# Patient Record
Sex: Male | Born: 1945
Health system: Southern US, Community
[De-identification: ages and names within clinical notes are randomized; demographics above are authoritative.]

## PROBLEM LIST (undated history)

## (undated) DIAGNOSIS — I499 Cardiac arrhythmia, unspecified: Secondary | ICD-10-CM

## (undated) DIAGNOSIS — G709 Myoneural disorder, unspecified: Secondary | ICD-10-CM

## (undated) DIAGNOSIS — K5900 Constipation, unspecified: Secondary | ICD-10-CM

## (undated) DIAGNOSIS — F419 Anxiety disorder, unspecified: Secondary | ICD-10-CM

## (undated) DIAGNOSIS — M48061 Spinal stenosis, lumbar region without neurogenic claudication: Secondary | ICD-10-CM

## (undated) DIAGNOSIS — M5416 Radiculopathy, lumbar region: Secondary | ICD-10-CM

## (undated) DIAGNOSIS — Z87442 Personal history of urinary calculi: Secondary | ICD-10-CM

## (undated) HISTORY — PX: MITRAL VALVE REPAIR: SHX2039

## (undated) HISTORY — PX: KIDNEY STONE SURGERY: SHX686

---

## 2005-01-14 ENCOUNTER — Inpatient Hospital Stay: Payer: Self-pay | Admitting: Internal Medicine

## 2005-01-17 ENCOUNTER — Emergency Department: Payer: Self-pay | Admitting: Emergency Medicine

## 2005-02-10 ENCOUNTER — Ambulatory Visit: Payer: Self-pay | Admitting: Cardiovascular Disease

## 2006-05-03 ENCOUNTER — Ambulatory Visit: Payer: Self-pay | Admitting: Urology

## 2020-03-21 ENCOUNTER — Other Ambulatory Visit: Payer: Self-pay | Admitting: Internal Medicine

## 2020-04-11 ENCOUNTER — Other Ambulatory Visit: Payer: Self-pay | Admitting: Orthopedic Surgery

## 2020-04-11 DIAGNOSIS — M5416 Radiculopathy, lumbar region: Secondary | ICD-10-CM

## 2020-04-22 ENCOUNTER — Other Ambulatory Visit: Payer: Self-pay | Admitting: Internal Medicine

## 2020-04-23 ENCOUNTER — Ambulatory Visit
Admission: RE | Admit: 2020-04-23 | Discharge: 2020-04-23 | Disposition: A | Discharge status: Home / Self Care | Payer: Medicare HMO | Source: Ambulatory Visit | Attending: Orthopedic Surgery | Admitting: Orthopedic Surgery

## 2020-04-23 ENCOUNTER — Other Ambulatory Visit: Payer: Self-pay

## 2020-04-23 DIAGNOSIS — M5416 Radiculopathy, lumbar region: Secondary | ICD-10-CM | POA: Diagnosis not present

## 2020-04-24 ENCOUNTER — Ambulatory Visit: Payer: Self-pay

## 2020-04-25 ENCOUNTER — Ambulatory Visit: Payer: Self-pay

## 2020-05-21 ENCOUNTER — Other Ambulatory Visit: Payer: Self-pay | Admitting: Neurosurgery

## 2020-05-26 ENCOUNTER — Other Ambulatory Visit: Payer: Self-pay | Admitting: Internal Medicine

## 2020-05-27 ENCOUNTER — Other Ambulatory Visit: Payer: Self-pay

## 2020-05-27 ENCOUNTER — Encounter
Admission: RE | Admit: 2020-05-27 | Discharge: 2020-05-27 | Disposition: A | Discharge status: Home / Self Care | Payer: Medicare HMO | Source: Ambulatory Visit | Attending: Neurosurgery | Admitting: Neurosurgery

## 2020-05-27 DIAGNOSIS — Z01818 Encounter for other preprocedural examination: Secondary | ICD-10-CM | POA: Insufficient documentation

## 2020-05-27 HISTORY — DX: Personal history of urinary calculi: Z87.442

## 2020-05-27 HISTORY — DX: Myoneural disorder, unspecified: G70.9

## 2020-05-27 HISTORY — DX: Cardiac arrhythmia, unspecified: I49.9

## 2020-05-27 HISTORY — DX: Anxiety disorder, unspecified: F41.9

## 2020-05-27 HISTORY — DX: Spinal stenosis, lumbar region without neurogenic claudication: M48.061

## 2020-05-27 HISTORY — DX: Radiculopathy, lumbar region: M54.16

## 2020-05-27 LAB — TYPE AND SCREEN
ABO/RH(D): O POS
Antibody Screen: NEGATIVE

## 2020-05-27 LAB — CBC
HCT: 34.4 % — ABNORMAL LOW (ref 39.0–52.0)
Hemoglobin: 12.2 g/dL — ABNORMAL LOW (ref 13.0–17.0)
MCH: 33.2 pg (ref 26.0–34.0)
MCHC: 35.5 g/dL (ref 30.0–36.0)
MCV: 93.5 fL (ref 80.0–100.0)
Platelets: 213 10*3/uL (ref 150–400)
RBC: 3.68 MIL/uL — ABNORMAL LOW (ref 4.22–5.81)
RDW: 13.2 % (ref 11.5–15.5)
WBC: 6.3 10*3/uL (ref 4.0–10.5)
nRBC: 0 % (ref 0.0–0.2)

## 2020-05-27 LAB — BASIC METABOLIC PANEL
Anion gap: 7 (ref 5–15)
BUN: 13 mg/dL (ref 8–23)
CO2: 29 mmol/L (ref 22–32)
Calcium: 8.4 mg/dL — ABNORMAL LOW (ref 8.9–10.3)
Chloride: 94 mmol/L — ABNORMAL LOW (ref 98–111)
Creatinine, Ser: 0.78 mg/dL (ref 0.61–1.24)
GFR calc Af Amer: >60 mL/min (ref >60)
GFR calc non Af Amer: >60 mL/min (ref >60)
Glucose, Bld: 108 mg/dL — ABNORMAL HIGH (ref 70–99)
Potassium: 3.4 mmol/L — ABNORMAL LOW (ref 3.5–5.1)
Sodium: 130 mmol/L — ABNORMAL LOW (ref 135–145)

## 2020-05-27 LAB — SURGICAL PCR SCREEN
MRSA, PCR: NEGATIVE
Staphylococcus aureus: NEGATIVE

## 2020-05-27 LAB — PROTIME-INR
INR: 0.9 (ref 0.8–1.2)
Prothrombin Time: 11.7 seconds (ref 11.4–15.2)

## 2020-05-27 LAB — APTT: aPTT: 27 seconds (ref 24–36)

## 2020-05-27 NOTE — Progress Notes (Addendum)
  [facility] Perioperative Services: Pre-Admission/Anesthesia Testing  Abnormal Lab Notification    Date: 05/27/20  Name: Shane Henderson MRN:   [id number]  Re: Abnormal labs noted during PAT appointment   Provider(s) Notified: Deetta Perla, MD Notification mode: Routed and/or faxed via [facility]   ABNORMAL LAB VALUE(S): Lab Results  Component Value Date   NA 130 (L) 05/27/2020   K 3.4 (L) 05/27/2020   CALCIUM 8.4 (L) 05/27/2020    Notes: Patient is on loop diuretic therapy (furosemide). Patient is scheduled for a L4-L5 laminectomy on 06/03/2020. Current values should have no implications on planned surgical/anesthetic course, however I will send to surgeon to make him aware. This is a Community education officer; no formal response is required.  Honor Loh, MSN, APRN, FNP-C, CEN [facility]  Peri-operative Services Nurse Practitioner Phone: [phone] 05/27/20 2:15 PM

## 2020-05-27 NOTE — Patient Instructions (Signed)
Your procedure is scheduled on: Monday 06/03/20.  Report to DAY SURGERY DEPARTMENT LOCATED ON 2ND FLOOR MEDICAL MALL ENTRANCE. To find out your arrival time please call [phone] between 1PM - 3PM on Friday 05/31/20.   Remember: Instructions that are not followed completely may result in serious medical risk, up to and including death, or upon the discretion of your surgeon and anesthesiologist your surgery may need to be rescheduled.     __X__ 1. Do not eat food after midnight the night before your procedure.                 No gum chewing or hard candies. You may drink clear liquids up to 2 hours                 before you are scheduled to arrive for your surgery- DO NOT drink clear                 liquids within 2 hours of the start of your surgery.                 Clear Liquids include:  water, apple juice without pulp, clear carbohydrate                 drink such as Clearfast or Gatorade, Black Coffee or Tea (Do not add                 milk or creamer to coffee or tea).  __X__2.  On the morning of surgery brush your teeth with toothpaste and water, you may rinse your mouth with mouthwash if you wish.  Do not swallow any toothpaste or mouthwash.    __X__ 3.  No Alcohol for 24 hours before or after surgery.  __X__ 4.  Do Not Smoke or use e-cigarettes For 24 Hours Prior to Your Surgery.                 Do not use any chewable tobacco products for at least 6 hours prior to                 surgery.  __X__5.  Notify your doctor if there is any change in your medical condition      (cold, fever, infections).      Do NOT wear jewelry, make-up, hairpins, clips or nail polish. Do NOT wear lotions, powders, or perfumes.  Do NOT shave 48 hours prior to surgery. Men may shave face and neck. Do NOT bring valuables to the hospital.     [facility] is not responsible for any belongings or valuables.   Contacts, dentures/partials or body piercings may not be worn into surgery. Bring a  case for your contacts, glasses or hearing aids, a denture cup will be supplied.    Patients discharged the day of surgery will not be allowed to drive home.     __X__ Take these medicines the morning of surgery with A SIP OF WATER:     1. amiodarone (PACERONE)   2. carvedilol (COREG)      __X__ Use CHG Soap as directed  __X__ Stop Anti-inflammatories 7 days before surgery such as Advil, Ibuprofen, Motrin, BC or Goodies Powder, Naprosyn, Naproxen, Aleve, Aspirin, Meloxicam. May take Tylenol or Tramadol if needed for pain or discomfort.   __X__Do not start taking any new herbal supplements or vitamins prior to your procedure.     Wear comfortable clothing (specific to your surgery type) to the hospital.  Plan  for stool softeners for home use; pain medications have a tendency to cause constipation. You can also help prevent constipation by eating foods high in fiber such as fruits and vegetables and drinking plenty of fluids as your diet allows.  After surgery, you can prevent lung complications by doing breathing exercises.Take deep breaths and cough every 1-2 hours. Your doctor may order a device called an Incentive Spirometer to help you take deep breaths.  Please call the [place] Department at [phone] if you have any questions about these instructions

## 2020-05-28 NOTE — Progress Notes (Signed)
  [facility] Perioperative Services: Pre-Admission/Anesthesia Testing     Date: 05/28/20  Name: Shane Henderson MRN:   [id number]  Re: Cardiac clearance for surgery  Patient pending laminectomy on 06/03/2020 with Dr. Deetta Perla.  During preoperative review, it is noted the patient has a past medical history significant for heart failure with reduced ejection fraction.  Patient was followed by cardiology Humphrey Rolls, MD), however I am unable to access clinical records and [facility].  At request sent to cardiology office requesting notes and results of any cardiac testing for this patient.  Received fax communication back on the morning of 05/28/2020 advising that further assessment/work-up prior to surgery is recommended.  Communicated with Hoy Register, NP via [facility] regarding cardiac clearance.  Cardiology provider requesting repeat TTE to assess for worsening HFrEF.    Per Cathren Laine, NP patient has been contacted to make aware of the need for clearance testing.  Patient is having TTE on 05/29/2020 with a follow-up visit on 05/30/2020 to discuss the results.  Cardiology provider to follow-up with PAT regarding clearance to proceed with surgery vs. need for further testing following office visit on 05/30/2020.Honor Loh, MSN, APRN, FNP-C, CEN [facility]  Peri-operative Services Nurse Practitioner Phone: [phone] 05/28/20 9:30 AM

## 2020-05-30 ENCOUNTER — Other Ambulatory Visit
Admission: RE | Admit: 2020-05-30 | Discharge: 2020-05-30 | Disposition: A | Discharge status: Home / Self Care | Payer: Medicare HMO | Source: Ambulatory Visit | Attending: Neurosurgery | Admitting: Neurosurgery

## 2020-05-30 ENCOUNTER — Other Ambulatory Visit: Payer: Self-pay

## 2020-05-30 DIAGNOSIS — Z20822 Contact with and (suspected) exposure to covid-19: Secondary | ICD-10-CM | POA: Insufficient documentation

## 2020-05-30 DIAGNOSIS — Z01812 Encounter for preprocedural laboratory examination: Secondary | ICD-10-CM | POA: Diagnosis present

## 2020-05-30 LAB — URINALYSIS, COMPLETE (UACMP) WITH MICROSCOPIC
Bacteria, UA: NONE SEEN
Bilirubin Urine: NEGATIVE
Glucose, UA: NEGATIVE mg/dL
Hgb urine dipstick: NEGATIVE
Ketones, ur: NEGATIVE mg/dL
Leukocytes,Ua: NEGATIVE
Nitrite: NEGATIVE
Protein, ur: NEGATIVE mg/dL
Specific Gravity, Urine: 1.011 (ref 1.005–1.030)
Squamous Epithelial / HPF: NONE SEEN (ref 0–5)
pH: 6 (ref 5.0–8.0)

## 2020-05-30 LAB — SARS CORONAVIRUS 2 (TAT 6-24 HRS): SARS Coronavirus 2: NEGATIVE

## 2020-05-31 NOTE — Progress Notes (Signed)
[facility] Perioperative Services  Pre-Admission/Anesthesia Testing Clinical Review  Date: 05/31/20  Patient Demographics:  Name: Shane Henderson DOB:   06-Dec-1945 MRN:   [id number]  Planned Surgical Procedure(s):    Case: [id number] Date/Time: 06/03/20 0938   Procedure: L4/5 LAMINECTOMY WITH MET-RX (N/A )   Anesthesia type: General   Pre-op diagnosis: lumbar stenosis   Location: [facility] OR ROOM 03 / [place] ORS FOR ANESTHESIA GROUP   Surgeons: Deetta Perla, MD     NOTE: Available PAT nursing documentation and vital signs have been reviewed. Clinical nursing staff has updated patient's PMH/PSHx, current medication list, and drug allergies/intolerances to ensure comprehensive history available to assist in medical decision making as it pertains to the aforementioned surgical procedure and anticipated anesthetic course.   Clinical Discussion:  Shane Henderson is a 74 y.o. male who is submitted for pre-surgical anesthesia review and clearance prior to him undergoing the above procedure. Patient has never been a smoker. Pertinent PMH includes: HFrEF, HTN, paroxysmal A.fib, AV regurgitation, neuromuscular disorder, lumbar stenosis, lumbar DDD, anxiety.   Patient is followed by cardiology Humphrey Rolls, MD). He was last seen in the cardiology clinic on 05/30/2020; notes reviewed. Patient doing well from a cardiac perspective. He denies an angina/anginal equivalent symptoms. No shortness of breath, PND, orthopnea, or presyncope/syncope. Patient's only complaint in the office was back and leg pain secondary to known lumbar stenosis and radiculopathy. Blood pressure well controlled at 138/82 on prescribed ACEi and beta blocker therapy. Exam reveal no peripheral edema; takes PRN furosemide. EKG showed a junctional rhythm; no A.fib; compliant with amiodarone. TTE performed on 05/29/2020 revealing an LVEF of 45-50% (see full results below).  Pre-surgical cardiac clearance issued on 05/31/2020 with a LOW  risk stratification. This patient is on daily antiplatelet therapy. He has been instructed on recommendations for holding his low dose daily ASA for 7 days prior to his procedure. The patient has been instructed that his last dose of his anticoagulant will be on 05/27/2020.  He denies previous intra-operative complications with anesthesia. Patient has a history of lower back pain. Recent MRI results included below for review by the anesthesia team in preparation for the possibility of potential neuraxial anesthesia during his upcoming laminectomy.  Vitals with BMI 05/27/2020  Height '6\' 0"'   Weight 110 lbs  BMI 49.20  Systolic 100  Diastolic 78  Pulse 60    Providers/Specialists:   NOTE: Primary physician provider listed below. Patient may have been seen by APP or partner within same practice.   PROVIDER ROLE LAST Edsel Petrin, MD Neurosurgery 05/21/2020  Cletis Athens, MD Primary Care Provider ?  Neoma Laming, MD Cardiology 05/30/2020   Allergies:  Iodinated diagnostic agents  Current Home Medications:   No current facility-administered medications for this encounter.   Marland Kitchen amiodarone (PACERONE) 200 MG tablet  . carvedilol (COREG) 6.25 MG tablet  . enalapril (VASOTEC) 10 MG tablet  . furosemide (LASIX) 20 MG tablet  . PACERONE 200 MG tablet  . tamsulosin (FLOMAX) 0.4 MG CAPS capsule  . traMADol (ULTRAM) 50 MG tablet   History:   Past Medical History:  Diagnosis Date  . Anxiety   . Dysrhythmia   . History of kidney stones   . Lumbar radiculopathy   . Lumbar stenosis   . Neuromuscular disorder [facility])    Past Surgical History:  Procedure Laterality Date  . KIDNEY STONE SURGERY    . MITRAL VALVE REPAIR     No family history on file.  Social History   Tobacco Use  . Smoking status: Never Smoker  . Smokeless tobacco: Current User    Types: Chew  Vaping Use  . Vaping Use: Never used  Substance Use Topics  . Alcohol use: Yes    Comment: Beer  . Drug use: Never      Pertinent Clinical Results:  LABS: Labs reviewed: Acceptable for surgery.  Hospital Outpatient Visit on 05/30/2020  Component Date Value Ref Range Status  . SARS Coronavirus 2 05/30/2020 NEGATIVE  NEGATIVE Final   Comment: (NOTE) SARS-CoV-2 target nucleic acids are NOT DETECTED.  The SARS-CoV-2 RNA is generally detectable in upper and lower respiratory specimens during the acute phase of infection. Negative results do not preclude SARS-CoV-2 infection, do not rule out co-infections with other pathogens, and should not be used as the sole basis for treatment or other patient management decisions. Negative results must be combined with clinical observations, patient history, and epidemiological information. The expected result is Negative.  Fact Sheet for Patients: SugarRoll.be  Fact Sheet for Healthcare Providers: https://www.woods-mathews.com/  This test is not yet approved or cleared by the [place] FDA and  has been authorized for detection and/or diagnosis of SARS-CoV-2 by FDA under an Emergency Use Authorization (EUA). This EUA will remain  in effect (meaning this test can be used) for the duration of the COVID-19 declaration under Se                          ction 564(b)(1) of the Act, 21 U.S.C. section 360bbb-3(b)(1), unless the authorization is terminated or revoked sooner.  Performed at [facility], [place] [address] [place], [place] [address]   . Color, Urine 05/30/2020 YELLOW* YELLOW Final  . APPearance 05/30/2020 CLEAR* CLEAR Final  . Specific Gravity, Urine 05/30/2020 1.011  1.005 - 1.030 Final  . pH 05/30/2020 6.0  5.0 - 8.0 Final  . Glucose, UA 05/30/2020 NEGATIVE  NEGATIVE mg/dL Final  . Hgb urine dipstick 05/30/2020 NEGATIVE  NEGATIVE Final  . Bilirubin Urine 05/30/2020 NEGATIVE  NEGATIVE Final  . Ketones, ur 05/30/2020 NEGATIVE  NEGATIVE mg/dL Final  . Protein, ur 05/30/2020 NEGATIVE  NEGATIVE mg/dL  Final  . Nitrite 05/30/2020 NEGATIVE  NEGATIVE Final  . Chalmers Guest 05/30/2020 NEGATIVE  NEGATIVE Final  . RBC / HPF 05/30/2020 0-5  0 - 5 RBC/hpf Final  . WBC, UA 05/30/2020 0-5  0 - 5 WBC/hpf Final  . Bacteria, UA 05/30/2020 NONE SEEN  NONE SEEN Final  . Squamous Epithelial / LPF 05/30/2020 NONE SEEN  0 - 5 Final  . Mucus 05/30/2020 PRESENT   Final   Performed at [facility], [address] [place], [place] [address]  Hospital Outpatient Visit on 05/27/2020  Component Date Value Ref Range Status  . aPTT 05/27/2020 27  24 - 36 seconds Final   Performed at [facility], [address] [place], [place] [address]  . Sodium 05/27/2020 130* 135 - 145 mmol/L Final  . Potassium 05/27/2020 3.4* 3.5 - 5.1 mmol/L Final  . Chloride 05/27/2020 94* 98 - 111 mmol/L Final  . CO2 05/27/2020 29  22 - 32 mmol/L Final  . Glucose, Bld 05/27/2020 108* 70 - 99 mg/dL Final   Glucose reference range applies only to samples taken after fasting for at least 8 hours.  . BUN 05/27/2020 13  8 - 23 mg/dL Final  . Creatinine, Ser 05/27/2020 0.78  0.61 - 1.24 mg/dL Final  .  Calcium 05/27/2020 8.4* 8.9 - 10.3 mg/dL Final  . GFR calc non Af Amer 05/27/2020 >60  >60 mL/min Final  . GFR calc Af Amer 05/27/2020 >60  >60 mL/min Final  . Anion gap 05/27/2020 7  5 - 15 Final   Performed at [facility], [address] [place], [place] [address]  . WBC 05/27/2020 6.3  4.0 - 10.5 K/uL Final  . RBC 05/27/2020 3.68* 4.22 - 5.81 MIL/uL Final  . Hemoglobin 05/27/2020 12.2* 13.0 - 17.0 g/dL Final  . HCT 05/27/2020 34.4* 39 - 52 % Final  . MCV 05/27/2020 93.5  80.0 - 100.0 fL Final  . MCH 05/27/2020 33.2  26.0 - 34.0 pg Final  . MCHC 05/27/2020 35.5  30.0 - 36.0 g/dL Final  . RDW 05/27/2020 13.2  11.5 - 15.5 % Final  . Platelets 05/27/2020 213  150 - 400 K/uL Final  . nRBC 05/27/2020 0.0  0.0 - 0.2 % Final   Performed at [facility], [address] [place], [place] [address]  .  MRSA, PCR 05/27/2020 NEGATIVE  NEGATIVE Final  . Staphylococcus aureus 05/27/2020 NEGATIVE  NEGATIVE Final   Comment: (NOTE) The Xpert SA Assay (FDA approved for NASAL specimens in patients 84 years of age and older), is one component of a comprehensive surveillance program. It is not intended to diagnose infection nor to guide or monitor treatment. Performed at [facility], [address], [place], [place] [address]   . ABO/RH(D) 05/27/2020 O POS   Final  . Antibody Screen 05/27/2020 NEG   Final  . Sample Expiration 05/27/2020 06/10/2020,2359   Final  . Extend sample reason 05/27/2020    Final                   Value:NO TRANSFUSIONS OR PREGNANCY IN THE PAST 3 MONTHS Performed at [facility], [address] [place], [place] [address]   . Prothrombin Time 05/27/2020 11.7  11.4 - 15.2 seconds Final  . INR 05/27/2020 0.9  0.8 - 1.2 Final   Comment: (NOTE) INR goal varies based on device and disease states. Performed at [facility], [address] [place],  [address]     ECG: Date: 05/27/2020 Time ECG obtained: 0751 AM Rate: 60 bpm Rhythm: junctional Axis (leads I and aVF): Left axis deviation Intervals: QRS 106 ms. QTc 474 ms. ST segment and T wave changes: TWI in leads II, III, aVF Comparison: No previous tracings available for review and comparison.    IMAGING / PROCEDURES: ECHOCARDIOGRAM done on 05/29/2020 1. LVEF 45-50% 2. Severe bilateral enlargement with ventricles and aorta appearing normal in size 3. Mild LV systolic dysfunction 4. RV systolic dysfunction 5. Normal LV wall motion 6. Abnormal RV wall motion 7. Mild LVH with grade 1 (relaxation abnormality) diastolic dysfunction 8. RV diastolic dysfunction 9. Mild pulmonary regurgitation 10. Moderate tricuspid regurgitation 11. Mild pulmonary hypertension 12. 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 Moderate to severe aortic regurgitation 14. No  pericardial effusion 15. Intra-atrial septal wall appears aneurysmal without evidence of shunting  MRI LUMBAR SPINE W/O CONTRAST done on 04/23/2020 1. Subacute benign-appearing L5 superior endplate compression fracture with up to 35% height loss centrally and 7 mm retropulsion. 2. Multilevel degenerative changes of the lumbar spine as described above, worst at L4-L5 where there is severe spinal canal and lateral recess stenosis.  Impression and Plan:  Shane Henderson has been referred for pre-anesthesia review and clearance prior him undergoing  the planned anesthetic and procedural courses. Available labs, pertinent testing, and imaging results were personally reviewed by me. This patient has been appropriately cleared by cardiology.   Based on clinical review performed today (05/31/20), barring any significant acute changes in the patient's overall condition, it is anticipated that he will be able to proceed with the planned surgical intervention. Any acute changes in clinical condition may necessitate his procedure being postponed and/or cancelled. Pre-surgical instructions were reviewed with the patient during his PAT appointment and questions were fielded by PAT clinical staff.  Honor Loh, MSN, APRN, FNP-C, CEN [facility]  Peri-operative Services Nurse Practitioner Phone: [phone] 05/31/20 11:34 AM  NOTE: This note has been prepared using Dragon dictation software. Despite my best ability to proofread, there is always the potential that unintentional transcriptional errors may still occur from this process.

## 2020-06-03 ENCOUNTER — Ambulatory Visit: Payer: Medicare HMO | Admitting: Urgent Care

## 2020-06-03 ENCOUNTER — Encounter
Admission: RE | Disposition: A | Discharge status: Home / Self Care | Payer: Self-pay | Source: Home / Self Care | Attending: Neurosurgery

## 2020-06-03 ENCOUNTER — Ambulatory Visit: Payer: Medicare HMO

## 2020-06-03 ENCOUNTER — Other Ambulatory Visit: Payer: Self-pay

## 2020-06-03 ENCOUNTER — Observation Stay
Admission: RE | Admit: 2020-06-03 | Discharge: 2020-06-03 | Disposition: A | Discharge status: Home / Self Care | Payer: Medicare HMO | Attending: Neurosurgery | Admitting: Neurosurgery

## 2020-06-03 ENCOUNTER — Encounter: Payer: Self-pay | Admitting: Neurosurgery

## 2020-06-03 DIAGNOSIS — Z419 Encounter for procedure for purposes other than remedying health state, unspecified: Secondary | ICD-10-CM

## 2020-06-03 DIAGNOSIS — M48061 Spinal stenosis, lumbar region without neurogenic claudication: Secondary | ICD-10-CM | POA: Diagnosis present

## 2020-06-03 DIAGNOSIS — F419 Anxiety disorder, unspecified: Secondary | ICD-10-CM | POA: Diagnosis not present

## 2020-06-03 DIAGNOSIS — M48062 Spinal stenosis, lumbar region with neurogenic claudication: Principal | ICD-10-CM | POA: Insufficient documentation

## 2020-06-03 DIAGNOSIS — M79609 Pain in unspecified limb: Secondary | ICD-10-CM | POA: Diagnosis present

## 2020-06-03 HISTORY — PX: LUMBAR LAMINECTOMY/ DECOMPRESSION WITH MET-RX: SHX5959

## 2020-06-03 LAB — ABO/RH: ABO/RH(D): O POS

## 2020-06-03 SURGERY — LUMBAR LAMINECTOMY/ DECOMPRESSION WITH MET-RX
Anesthesia: General

## 2020-06-03 MED ORDER — GLYCOPYRROLATE 0.2 MG/ML IJ SOLN: 0.2000 mg | Freq: Once | INTRAMUSCULAR | Status: AC

## 2020-06-03 MED ORDER — ACETAMINOPHEN 10 MG/ML IV SOLN
INTRAVENOUS | Status: DC | PRN
  Administered 2020-06-03: 500 mg via INTRAVENOUS

## 2020-06-03 MED ORDER — ROCURONIUM BROMIDE 10 MG/ML (PF) SYRINGE
PREFILLED_SYRINGE | INTRAVENOUS | Status: AC
  Filled 2020-06-03: qty 10

## 2020-06-03 MED ORDER — OXYCODONE HCL 5 MG PO TABS: 5.0000 mg | ORAL_TABLET | Freq: Four times a day (QID) | ORAL | 0 refills | Status: DC | PRN

## 2020-06-03 MED ORDER — MIDAZOLAM HCL 2 MG/2ML IJ SOLN
INTRAMUSCULAR | Status: AC
  Filled 2020-06-03: qty 2

## 2020-06-03 MED ORDER — LIDOCAINE HCL (PF) 2 % IJ SOLN
INTRAMUSCULAR | Status: AC
  Filled 2020-06-03: qty 5

## 2020-06-03 MED ORDER — FAMOTIDINE 20 MG PO TABS
ORAL_TABLET | ORAL | Status: AC
  Administered 2020-06-03: 20 mg via ORAL
  Filled 2020-06-03: qty 1

## 2020-06-03 MED ORDER — ONDANSETRON HCL 4 MG/2ML IJ SOLN: 4.0000 mg | Freq: Once | INTRAMUSCULAR | Status: DC | PRN

## 2020-06-03 MED ORDER — GLYCOPYRROLATE 0.2 MG/ML IJ SOLN
INTRAMUSCULAR | Status: AC
  Administered 2020-06-03: 0.2 mg via INTRAVENOUS
  Filled 2020-06-03: qty 1

## 2020-06-03 MED ORDER — ACETAMINOPHEN 10 MG/ML IV SOLN
INTRAVENOUS | Status: AC
  Filled 2020-06-03: qty 100

## 2020-06-03 MED ORDER — LIDOCAINE HCL (CARDIAC) PF 100 MG/5ML IV SOSY
PREFILLED_SYRINGE | INTRAVENOUS | Status: DC | PRN
  Administered 2020-06-03: 60 mg via INTRAVENOUS

## 2020-06-03 MED ORDER — FENTANYL CITRATE (PF) 100 MCG/2ML IJ SOLN
INTRAMUSCULAR | Status: AC
  Filled 2020-06-03: qty 2

## 2020-06-03 MED ORDER — PROPOFOL 10 MG/ML IV BOLUS
INTRAVENOUS | Status: AC
  Filled 2020-06-03: qty 40

## 2020-06-03 MED ORDER — PROPOFOL 10 MG/ML IV BOLUS
INTRAVENOUS | Status: DC | PRN
  Administered 2020-06-03: 80 mg via INTRAVENOUS

## 2020-06-03 MED ORDER — SUCCINYLCHOLINE CHLORIDE 20 MG/ML IJ SOLN
INTRAMUSCULAR | Status: DC | PRN
  Administered 2020-06-03: 60 mg via INTRAVENOUS

## 2020-06-03 MED ORDER — METHOCARBAMOL 500 MG PO TABS: 500.0000 mg | ORAL_TABLET | Freq: Four times a day (QID) | ORAL | Status: DC | PRN

## 2020-06-03 MED ORDER — FENTANYL CITRATE (PF) 100 MCG/2ML IJ SOLN: 25.0000 ug | INTRAMUSCULAR | Status: DC | PRN

## 2020-06-03 MED ORDER — TAMSULOSIN HCL 0.4 MG PO CAPS: 0.4000 mg | ORAL_CAPSULE | Freq: Every day | ORAL | Status: DC

## 2020-06-03 MED ORDER — METHOCARBAMOL 500 MG PO TABS: 500.0000 mg | ORAL_TABLET | Freq: Four times a day (QID) | ORAL | 0 refills | Status: DC | PRN

## 2020-06-03 MED ORDER — ACETAMINOPHEN 500 MG PO TABS: 500.0000 mg | ORAL_TABLET | Freq: Four times a day (QID) | ORAL | 0 refills | Status: DC | PRN

## 2020-06-03 MED ORDER — CEFAZOLIN SODIUM-DEXTROSE 2-4 GM/100ML-% IV SOLN
2.0000 g | Freq: Once | INTRAVENOUS | Status: AC
  Administered 2020-06-03: 2 g via INTRAVENOUS

## 2020-06-03 MED ORDER — OXYCODONE HCL 5 MG PO TABS: 5.0000 mg | ORAL_TABLET | Freq: Once | ORAL | Status: DC | PRN

## 2020-06-03 MED ORDER — DEXAMETHASONE SODIUM PHOSPHATE 10 MG/ML IJ SOLN
INTRAMUSCULAR | Status: DC | PRN
  Administered 2020-06-03: 10 mg via INTRAVENOUS

## 2020-06-03 MED ORDER — OXYCODONE HCL 5 MG/5ML PO SOLN: 5.0000 mg | Freq: Once | ORAL | Status: DC | PRN

## 2020-06-03 MED ORDER — ONDANSETRON HCL 4 MG/2ML IJ SOLN
INTRAMUSCULAR | Status: AC
  Filled 2020-06-03: qty 2

## 2020-06-03 MED ORDER — EPHEDRINE SULFATE 50 MG/ML IJ SOLN
INTRAMUSCULAR | Status: DC | PRN
  Administered 2020-06-03: 5 mg via INTRAVENOUS

## 2020-06-03 MED ORDER — OXYCODONE HCL 5 MG PO TABS: 5.0000 mg | ORAL_TABLET | ORAL | 0 refills | Status: DC | PRN

## 2020-06-03 MED ORDER — ACETAMINOPHEN 650 MG RE SUPP: 650.0000 mg | Freq: Four times a day (QID) | RECTAL | Status: DC | PRN

## 2020-06-03 MED ORDER — ONDANSETRON HCL 4 MG/2ML IJ SOLN
INTRAMUSCULAR | Status: DC | PRN
  Administered 2020-06-03: 4 mg via INTRAVENOUS

## 2020-06-03 MED ORDER — THROMBIN 5000 UNITS EX SOLR
CUTANEOUS | Status: DC | PRN
  Administered 2020-06-03: 5000 [IU] via TOPICAL

## 2020-06-03 MED ORDER — AMIODARONE HCL 200 MG PO TABS: 200.0000 mg | ORAL_TABLET | Freq: Every day | ORAL | Status: DC

## 2020-06-03 MED ORDER — LIDOCAINE-EPINEPHRINE (PF) 1 %-1:200000 IJ SOLN
INTRAMUSCULAR | Status: DC | PRN
  Administered 2020-06-03: 4 mL

## 2020-06-03 MED ORDER — ACETAMINOPHEN 325 MG PO TABS: 650.0000 mg | ORAL_TABLET | Freq: Four times a day (QID) | ORAL | Status: DC | PRN

## 2020-06-03 MED ORDER — CHLORHEXIDINE GLUCONATE 0.12 % MT SOLN: 15.0000 mL | Freq: Once | OROMUCOSAL | Status: AC

## 2020-06-03 MED ORDER — SENNA 8.6 MG PO TABS: 1.0000 | ORAL_TABLET | Freq: Two times a day (BID) | ORAL | 0 refills | Status: DC

## 2020-06-03 MED ORDER — TRAMADOL HCL 50 MG PO TABS: 50.0000 mg | ORAL_TABLET | Freq: Four times a day (QID) | ORAL | Status: DC | PRN

## 2020-06-03 MED ORDER — CARVEDILOL 12.5 MG PO TABS: 6.2500 mg | ORAL_TABLET | Freq: Every day | ORAL | Status: DC

## 2020-06-03 MED ORDER — SENNA 8.6 MG PO TABS: 1.0000 | ORAL_TABLET | Freq: Two times a day (BID) | ORAL | Status: DC

## 2020-06-03 MED ORDER — CEFAZOLIN SODIUM-DEXTROSE 2-4 GM/100ML-% IV SOLN
INTRAVENOUS | Status: AC
  Filled 2020-06-03: qty 100

## 2020-06-03 MED ORDER — FAMOTIDINE 20 MG PO TABS: 20.0000 mg | ORAL_TABLET | Freq: Once | ORAL | Status: AC

## 2020-06-03 MED ORDER — ORAL CARE MOUTH RINSE: 15.0000 mL | Freq: Once | OROMUCOSAL | Status: AC

## 2020-06-03 MED ORDER — CHLORHEXIDINE GLUCONATE 0.12 % MT SOLN
OROMUCOSAL | Status: AC
  Administered 2020-06-03: 15 mL via OROMUCOSAL
  Filled 2020-06-03: qty 15

## 2020-06-03 MED ORDER — ONDANSETRON 4 MG PO TBDP
4.0000 mg | ORAL_TABLET | Freq: Four times a day (QID) | ORAL | Status: DC | PRN
  Filled 2020-06-03: qty 1

## 2020-06-03 MED ORDER — LACTATED RINGERS IV SOLN: INTRAVENOUS | Status: DC

## 2020-06-03 MED ORDER — FENTANYL CITRATE (PF) 100 MCG/2ML IJ SOLN
INTRAMUSCULAR | Status: DC | PRN
  Administered 2020-06-03 (×2): 50 ug via INTRAVENOUS

## 2020-06-03 MED ORDER — ENALAPRIL MALEATE 10 MG PO TABS
10.0000 mg | ORAL_TABLET | Freq: Every day | ORAL | Status: DC
  Filled 2020-06-03 (×2): qty 1

## 2020-06-03 MED ORDER — METHYLPREDNISOLONE ACETATE 40 MG/ML IJ SUSP
INTRAMUSCULAR | Status: DC | PRN
  Administered 2020-06-03: 40 mg

## 2020-06-03 MED ORDER — DEXMEDETOMIDINE (PRECEDEX) IN NS 20 MCG/5ML (4 MCG/ML) IV SYRINGE
PREFILLED_SYRINGE | INTRAVENOUS | Status: DC | PRN
  Administered 2020-06-03 (×2): 4 ug via INTRAVENOUS

## 2020-06-03 MED ORDER — ONDANSETRON HCL 4 MG/2ML IJ SOLN: 4.0000 mg | Freq: Four times a day (QID) | INTRAMUSCULAR | Status: DC | PRN

## 2020-06-03 MED ORDER — CARVEDILOL 12.5 MG PO TABS
ORAL_TABLET | ORAL | Status: AC
  Administered 2020-06-03: 6.25 mg via ORAL
  Filled 2020-06-03: qty 1

## 2020-06-03 SURGICAL SUPPLY — 59 items
BUR NEURO DRILL SOFT 3.0X3.8M (BURR) ×3 IMPLANT
CANISTER SUCT 1200ML W/VALVE (MISCELLANEOUS) ×6 IMPLANT
CHLORAPREP W/TINT 26 (MISCELLANEOUS) ×6 IMPLANT
COUNTER NEEDLE 20/40 LG (NEEDLE) ×3 IMPLANT
COVER LIGHT HANDLE STERIS (MISCELLANEOUS) ×6 IMPLANT
COVER WAND RF STERILE (DRAPES) ×3 IMPLANT
CUP MEDICINE 2OZ PLAST GRAD ST (MISCELLANEOUS) ×3 IMPLANT
DERMABOND ADVANCED (GAUZE/BANDAGES/DRESSINGS) ×2
DERMABOND ADVANCED .7 DNX12 (GAUZE/BANDAGES/DRESSINGS) ×1 IMPLANT
DRAPE C-ARM 42X72 X-RAY (DRAPES) ×6 IMPLANT
DRAPE LAPAROTOMY 100X77 ABD (DRAPES) ×3 IMPLANT
DRAPE MICROSCOPE SPINE 48X150 (DRAPES) ×3 IMPLANT
DRAPE SURG 17X11 SM STRL (DRAPES) ×3 IMPLANT
DRSG TEGADERM 4X4.75 (GAUZE/BANDAGES/DRESSINGS) ×3 IMPLANT
DRSG TELFA 4X3 1S NADH ST (GAUZE/BANDAGES/DRESSINGS) ×3 IMPLANT
DURASEAL APPLICATOR TIP (TIP) IMPLANT
DURASEAL SPINE SEALANT 3ML (MISCELLANEOUS) IMPLANT
ELECT CAUTERY BLADE TIP 2.5 (TIP) ×3
ELECT EZSTD 165MM 6.5IN (MISCELLANEOUS) ×3
ELECT REM PT RETURN 9FT ADLT (ELECTROSURGICAL) ×3
ELECTRODE CAUTERY BLDE TIP 2.5 (TIP) ×1 IMPLANT
ELECTRODE EZSTD 165MM 6.5IN (MISCELLANEOUS) ×1 IMPLANT
ELECTRODE REM PT RTRN 9FT ADLT (ELECTROSURGICAL) ×1 IMPLANT
GAUZE SPONGE 4X4 12PLY STRL (GAUZE/BANDAGES/DRESSINGS) ×3 IMPLANT
GLOVE BIOGEL PI IND STRL 7.0 (GLOVE) ×1 IMPLANT
GLOVE BIOGEL PI IND STRL 8 (GLOVE) ×3 IMPLANT
GLOVE BIOGEL PI INDICATOR 7.0 (GLOVE) ×2
GLOVE BIOGEL PI INDICATOR 8 (GLOVE) ×6
GLOVE SURG SYN 7.0 (GLOVE) ×6 IMPLANT
GLOVE SURG SYN 8.0 (GLOVE) ×3 IMPLANT
GOWN STRL REUS W/ TWL XL LVL3 (GOWN DISPOSABLE) ×2 IMPLANT
GOWN STRL REUS W/TWL XL LVL3 (GOWN DISPOSABLE) ×4
GRADUATE 1200CC STRL 31836 (MISCELLANEOUS) ×3 IMPLANT
IV CATH ANGIO 12GX3 LT BLUE (NEEDLE) ×3 IMPLANT
KIT TURNOVER KIT A (KITS) ×3 IMPLANT
KIT WILSON FRAME (KITS) ×3 IMPLANT
KNIFE BAYONET SHORT DISCETOMY (MISCELLANEOUS) IMPLANT
MARKER SKIN DUAL TIP RULER LAB (MISCELLANEOUS) ×6 IMPLANT
NDL SAFETY ECLIPSE 18X1.5 (NEEDLE) ×1 IMPLANT
NEEDLE HYPO 18GX1.5 SHARP (NEEDLE) ×2
NEEDLE HYPO 22GX1.5 SAFETY (NEEDLE) ×3 IMPLANT
NS IRRIG 1000ML POUR BTL (IV SOLUTION) ×3 IMPLANT
PACK LAMINECTOMY NEURO (CUSTOM PROCEDURE TRAY) ×3 IMPLANT
PAD ARMBOARD 7.5X6 YLW CONV (MISCELLANEOUS) ×3 IMPLANT
SPOGE SURGIFLO 8M (HEMOSTASIS) ×2
SPONGE SURGIFLO 8M (HEMOSTASIS) ×1 IMPLANT
STAPLER SKIN PROX 35W (STAPLE) IMPLANT
SUT NURALON 4 0 TR CR/8 (SUTURE) IMPLANT
SUT POLYSORB 2-0 5X18 GS-10 (SUTURE) ×3 IMPLANT
SUT VIC AB 0 CT1 18XCR BRD 8 (SUTURE) ×1 IMPLANT
SUT VIC AB 0 CT1 8-18 (SUTURE) ×2
SYR 10ML LL (SYRINGE) ×6 IMPLANT
SYR 30ML LL (SYRINGE) ×3 IMPLANT
SYR 3ML LL SCALE MARK (SYRINGE) ×3 IMPLANT
TOWEL OR 17X26 4PK STRL BLUE (TOWEL DISPOSABLE) ×12 IMPLANT
TUBE MATRX SPINL 18MM 3CM DISP (INSTRUMENTS) ×2
TUBE METRX SPINAL 18X3 DISP (INSTRUMENTS) ×1 IMPLANT
TUBING CONNECTING 10 (TUBING) ×2 IMPLANT
TUBING CONNECTING 10' (TUBING) ×1

## 2020-06-03 NOTE — Op Note (Signed)
SURGERY DATE:06/03/2020  PRE-OP DIAGNOSIS: Lumbar Stenosis withNeurogenic Claudication  POST-OP DIAGNOSIS:Post-Op Diagnosis Codes: Lumbar Stenosis withNeurogenic Claudication  Procedure(s) with comments: L4/5 Laminectomy  SURGEON:  * Malen Gauze, MD  Lonell Face, NPAssistant  ANESTHESIA:General  OPERATIVE FINDINGS: Central and lateral recess stenosis at L4/5  OPERATIVE REPORT:   Indication: Mr. Kolar to clinic on7/27withongoing severelegpain.The pain was limiting hisactivity.MRI revealedL4/5 with central stenosis.We discusseddecompression at that level.Therisks of surgery were explained to include hematoma, infection, damage to nerve roots, CSF leak, weakness, numbness, pain, need for future surgery including fusion, heart attack, and stroke.Heelected to proceed with surgery for symptom relief.  Procedure The patient was brought to the OR after informed consent was obtained.He was given general anesthesia and intubated by the anesthesia service. Vascular access lines were placed.The patient was then placed prone on a Wilson frameensuring all pressure points were padded.Antibiotics were administered.A time-out was performed per protocol.   The patient was sterilely prepped and draped.Theincision was planned1.5cm off midlineon therightat L4/5.Fluoroscopy confirmed the level.The skin was opened sharply and the dissection taken to the fascia. This was incised and initial dilator placedthe spinous processes and lamina of L4on therightout to the medial edge of the facet. Serial dilatorswere inserted via fluoroscopy and the final48mm tube was placed at depth of3cm.  The microscope was brought into the field. The overlying muscle was removed from lamina and medial facet.Next, a matchstickdrill bit was used to remove theL4lamina centrallyand going laterally on both sidesThe underlying ligament was freed and  removed with combination of rongeurs.The dura wasseen to be free at that level. The dura was followed inferiorlyto the L5 lamina.Once the central decompression was performed, we ensured the lateral recesses were freed from hypertrophied ligament. There was also seen to be synovial fragments and hypertrophied facets that were removed with rongeurs. The dura was seen to be full and intact.Hemostasis was obtained and wound irrigated.Steroidwas placed along thecal sac and nerve root.The working channel was removed.  The microscope was removed.Thefasciawas then closed using 0vicryl followed by thesubcutaneous and dermal layers with 2-0 vicryluntil the epidermis was well approximated. The skin was closed withDermabond..  The patient was returned to supine position and extubated by the anesthesia service. The patient was then taken to the PACU for post-operative care wherehe was moving extremities symmetrically.   ESTIMATED BLOOD LOSS: 20cc  SPECIMENS None  IMPLANT None   I performed the case in its entiretywith assistance ofChristine Zdeb, NP  Deetta Perla, [place]

## 2020-06-03 NOTE — Interval H&P Note (Signed)
History and Physical Interval Note:  06/03/2020 8:39 AM  Shane Henderson  has presented today for surgery, with the diagnosis of lumbar stenosis.  The various methods of treatment have been discussed with the patient and family. After consideration of risks, benefits and other options for treatment, the patient has consented to  Procedure(s): L4/5 LAMINECTOMY WITH MET-RX (N/A) as a surgical intervention.  The patient's history has been reviewed, patient examined, no change in status, stable for surgery.  I have reviewed the patient's chart and labs.  Questions were answered to the patient's satisfaction.     Deetta Perla

## 2020-06-03 NOTE — Transfer of Care (Signed)
Immediate Anesthesia Transfer of Care Note  Patient: Shane Henderson  Procedure(s) Performed: L4/5 LAMINECTOMY WITH MET-RX (N/A )  Patient Location: PACU  Anesthesia Type:General  Level of Consciousness: drowsy  Airway & Oxygen Therapy: Patient Spontanous Breathing and Patient connected to face mask oxygen  Post-op Assessment: Report given to RN and Post -op Vital signs reviewed and stable  Post vital signs: Reviewed and stable  Last Vitals:  Vitals Value Taken Time  BP 124/62 06/03/20 1204  Temp    Pulse 46 06/03/20 1206  Resp 11 06/03/20 1206  SpO2 100 % 06/03/20 1206  Vitals shown include unvalidated device data.  Last Pain:  Vitals:   06/03/20 0849  TempSrc: Temporal  PainSc: 2          Complications: No complications documented.

## 2020-06-03 NOTE — Anesthesia Preprocedure Evaluation (Addendum)
Anesthesia Evaluation  Patient identified by MRN, date of birth, ID band Patient awake    Reviewed: Allergy & Precautions, NPO status , Patient's Chart, lab work & pertinent test results  History of Anesthesia Complications Negative for: history of anesthetic complications  Airway Mallampati: I  TM Distance: >3 FB Neck ROM: Full    Dental  (+) Poor Dentition, Dental Advisory Given   Pulmonary neg pulmonary ROS, neg sleep apnea, neg COPD, Patient abstained from smoking.Not current smoker,    Pulmonary exam normal breath sounds clear to auscultation       Cardiovascular Exercise Tolerance: Good METS(-) hypertension+CHF  (-) CAD and (-) Past MI + dysrhythmias Atrial Fibrillation + Valvular Problems/Murmurs AI  Rhythm:Regular Rate:Normal - Systolic murmurs Works on his farm/garden, physically active. ECHOCARDIOGRAM done on 05/29/2020 1. LVEF 45-50% 2. Severe bilateral enlargement with ventricles and aorta appearing normal in size 3. Mild LV systolic dysfunction 4. RV systolic dysfunction 5. Normal LV wall motion 6. Abnormal RV wall motion 7. Mild LVH with grade 1 (relaxation abnormality) diastolic dysfunction 8. RV diastolic dysfunction 9. Mild pulmonary regurgitation 10. Moderate tricuspid regurgitation 11. Mild pulmonary hypertension 12. 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 Moderate to severe aortic regurgitation 14. No pericardial effusion 15. Intra-atrial septal wall appears aneurysmal without evidence of shunting  Evaluated by cardiology last week, no further optimization deemed necessary   Neuro/Psych PSYCHIATRIC DISORDERS Anxiety  Neuromuscular disease    GI/Hepatic neg GERD  ,(+)     (-) substance abuse  , Uses chewing tobacco   Endo/Other  neg diabetes  Renal/GU negative Renal ROS     Musculoskeletal  (+) Arthritis ,   Abdominal   Peds  Hematology   Anesthesia Other  Findings Past Medical History: No date: Anxiety No date: Dysrhythmia No date: History of kidney stones No date: Lumbar radiculopathy No date: Lumbar stenosis No date: Neuromuscular disorder (HCC)  Reproductive/Obstetrics                            Anesthesia Physical Anesthesia Plan  ASA: III  Anesthesia Plan: General   Post-op Pain Management:    Induction: Intravenous  PONV Risk Score and Plan: 3 and Ondansetron, Dexamethasone and Treatment may vary due to age or medical condition  Airway Management Planned: Oral ETT and Video Laryngoscope Planned  Additional Equipment: None  Intra-op Plan:   Post-operative Plan: Extubation in OR  Informed Consent: I have reviewed the patients History and Physical, chart, labs and discussed the procedure including the risks, benefits and alternatives for the proposed anesthesia with the patient or authorized representative who has indicated his/her understanding and acceptance.     Dental advisory given  Plan Discussed with: CRNA and Surgeon  Anesthesia Plan Comments: (Discussed risks of anesthesia with patient, including PONV, sore throat, lip/dental damage. Rare risks discussed as well, such as cardiorespiratory and neurological sequelae. Patient understands.)        Anesthesia Quick Evaluation

## 2020-06-03 NOTE — Interval H&P Note (Signed)
History and Physical Interval Note: ° °06/03/2020 °8:39 AM ° °Shane Henderson  has presented today for surgery, with the diagnosis of lumbar stenosis.  The various methods of treatment have been discussed with the patient and family. After consideration of risks, benefits and other options for treatment, the patient has consented to  Procedure(s): °L4/5 LAMINECTOMY WITH MET-RX (N/A) as a surgical intervention.  The patient's history has been reviewed, patient examined, no change in status, stable for surgery.  I have reviewed the patient's chart and labs.  Questions were answered to the patient's satisfaction.   ° ° °Arkel Cartwright ° ° °

## 2020-06-03 NOTE — Discharge Summary (Signed)
error 

## 2020-06-03 NOTE — Progress Notes (Signed)
Discharge instructions explained to pt> Pt verbalized understanding of instructions. IV removed and pt was escorted out by nsg staff

## 2020-06-03 NOTE — Progress Notes (Signed)
Spoke with patients wife. She does have his walking cane

## 2020-06-03 NOTE — Discharge Instructions (Addendum)
AMBULATORY SURGERY  DISCHARGE INSTRUCTIONS   1) The drugs that you were given will stay in your system until tomorrow so for the next 24 hours you should not:  A) Drive an automobile B) Make any legal decisions C) Drink any alcoholic beverage   2) You may resume regular meals tomorrow.  Today it is better to start with liquids and gradually work up to solid foods.  You may eat anything you prefer, but it is better to start with liquids, then soup and crackers, and gradually work up to solid foods.   3) Please notify your doctor immediately if you have any unusual bleeding, trouble breathing, redness and pain at the surgery site, drainage, fever, or pain not relieved by medication.    4) Additional Instructions:        Please contact your physician with any problems or Same Day Surgery at [phone], Monday through Friday 6 am to 4 pm, or [place] [facility] number at [phone]. Your surgeon has performed an operation on your lumbar spine (low back) to relieve pressure on one or more nerves. Many times, patients feel better immediately after surgery and can "overdo it." Even if you feel well, it is important that you follow these activity guidelines. If you do not let your back heal properly from the surgery, you can increase the chance of a disc herniation and/or return of your symptoms. The following are instructions to help in your recovery once you have been discharged from the hospital.  * Do not take anti-inflammatory medications for 3 days after surgery (naproxen [Aleve], ibuprofen [Advil, Motrin], celecoxib [Celebrex], etc.)  Activity    No bending, lifting, or twisting ("BLT"). Avoid lifting objects heavier than 10 pounds (gallon milk jug).  Where possible, avoid household activities that involve lifting, bending, pushing, or pulling such as laundry, vacuuming, grocery shopping, and childcare. Try to arrange for help from friends and family for these  activities while your back heals.  Increase physical activity slowly as tolerated.  Taking short walks is encouraged, but avoid strenuous exercise. Do not jog, run, bicycle, lift weights, or participate in any other exercises unless specifically allowed by your doctor. Avoid prolonged sitting, including car rides.  Talk to your doctor before resuming sexual activity.  You should not drive until cleared by your doctor.  Until released by your doctor, you should not return to work or school.  You should rest at home and let your body heal.   You may shower two days after your surgery.  After showering, lightly dab your incision dry. Do not take a tub bath or go swimming for 3 weeks, or until approved by your doctor at your follow-up appointment.  If you smoke, we strongly recommend that you quit.  Smoking has been proven to interfere with normal healing in your back and will dramatically reduce the success rate of your surgery. Please contact [facility] (800-QUIT-NOW) and use the resources at www.[facility].com for assistance in stopping smoking.  Surgical Incision   If you have a dressing on your incision, you may remove it three days after your surgery. Keep your incision area clean and dry.  If you have staples or stitches on your incision, you should have a follow up scheduled for removal. If you do not have staples or stitches, you will have steri-strips (small pieces of surgical tape) or Dermabond glue. The steri-strips/glue should begin to peel away within about a week (it is fine if the steri-strips fall off before   then). If the strips are still in place one week after your surgery, you may gently remove them.  Diet            You may return to your usual diet. Be sure to stay hydrated.  When to Contact Us  Although your surgery and recovery will likely be uneventful, you may have some residual numbness, aches, and pains in your back and/or legs. This is normal and should improve in  the next few weeks.  However, should you experience any of the following, contact us immediately: . New numbness or weakness . Pain that is progressively getting worse, and is not relieved by your pain medications or rest . Bleeding, redness, swelling, pain, or drainage from surgical incision . Chills or flu-like symptoms . Fever greater than 101.0 F (38.3 C) . Problems with bowel or bladder functions . Difficulty breathing or shortness of breath . Warmth, tenderness, or swelling in your calf  Contact Information . During office hours (Monday-Friday 9 am to 5 pm), please call your physician at [phone] . After hours and weekends, please call [phone] and an answering service will put you in touch with either Dr. Sartaj Hoskin or Dr. Yarbrough.  . For a life-threatening emergency, call 911  

## 2020-06-03 NOTE — Progress Notes (Signed)
Pt HR: 37-43. Dr. Bertell Maria notified. Acknolwedged. Orders received.

## 2020-06-03 NOTE — Progress Notes (Signed)
Ardeth Sportsman, RN and Quin Hoop RN performed in and out cath.

## 2020-06-03 NOTE — Anesthesia Procedure Notes (Signed)
Procedure Name: Intubation Date/Time: 06/03/2020 9:42 AM Performed by: Arita Miss, MD Pre-anesthesia Checklist: Patient identified, Patient being monitored, Timeout performed, Emergency Drugs available and Suction available Patient Re-evaluated:Patient Re-evaluated prior to induction Oxygen Delivery Method: Circle system utilized Preoxygenation: Pre-oxygenation with 100% oxygen Induction Type: IV induction Ventilation: Mask ventilation without difficulty Laryngoscope Size: Miller and 2 Grade View: Grade I Tube type: Oral Tube size: 7.0 mm Number of attempts: 2 Airway Equipment and Method: Stylet Placement Confirmation: ETT inserted through vocal cords under direct vision,  positive ETCO2 and breath sounds checked- equal and bilateral Secured at: 22 cm Tube secured with: Tape Dental Injury: Teeth and Oropharynx as per pre-operative assessment  Difficulty Due To: Difficulty was unanticipated and Difficult Airway- due to immobile epiglottis Comments: Grade 3 view with Mcgrath 3. Switched to Avaya 2 with successful lifting of stiff epiglottis and subsequent Grade 1 view, however some difficulty in advancing the tube past the cords. Gentle twisting of the tube guided it in. Perhaps mild subglottic stenosis.

## 2020-06-03 NOTE — H&P (Signed)
Shane Henderson is an 74 y.o. male.   Chief Complaint: leg pain  HPI: Shane Henderson is here for evaluation again after having recent treatment for his leg pain including injections and physical therapy. He states that this has not improved the symptoms and he still is having pain occurring in the back of the buttocks into the thighs whenever he gets up to move around. This is mostly relieved with sitting down and massaging his legs. He does not complain of any significant back pain at this time. He still is limiting his activity given that the pain does not allow him to do full work around the house and yard. MRI shows stenosis at L4/5 and he is her for decompression   Past Medical History:  Diagnosis Date  . Anxiety   . Dysrhythmia   . History of kidney stones   . Lumbar radiculopathy   . Lumbar stenosis   . Neuromuscular disorder [facility])     Past Surgical History:  Procedure Laterality Date  . KIDNEY STONE SURGERY    . MITRAL VALVE REPAIR      No family history on file. Social History:  reports that he has never smoked. His smokeless tobacco use includes chew. He reports current alcohol use. He reports that he does not use drugs.  Allergies:  Allergies  Allergen Reactions  . Iodinated Diagnostic Agents Rash    No medications prior to admission.    No results found for this or any previous visit (from the past 48 hour(s)). No results found.  Review of Systems General ROS: Negative Respiratory ROS: Negative Cardiovascular ROS: Negative Gastrointestinal ROS: Negative Genito-Urinary ROS: Negative Musculoskeletal ROS: Negative for back pain Neurological ROS: Positive for leg pain Dermatological ROS: Negative  There were no vitals taken for this visit. Physical Exam  General appearance: Alert, cooperative, in no acute distress CV: Regular rate and rhythm Pulm: Clear to auscultation Back: No tenderness to palpation, no obvious injury  Neurologic exam:  Mental status:  alertness: alert, affect: normal Speech: fluent and clear Motor:strength symmetric 5/5 in bilateral hip flexion, knee flexion, knee extension, dorsi, plantar flexion Sensory: intact to light touch in bilateral lower extremities Gait: Antalgic gait  MRI lumbar spine: There is straightening of the lordotic curvature. There is a mild compression fracture at L5 with approximately 25% height loss. This does result in some retropulsion of the disc and superior endplate causing severe central stenosis when coupled with hypertrophied ligamentum flavum. There are no other levels of central or foraminal stenosis noted. There is no change in alignment.   Assessment/Plan We will proceed with L4/5 decompression  Deetta Perla, MD 06/03/2020, 7:38 AM

## 2020-06-03 NOTE — Progress Notes (Signed)
Surgical Prophylaxis  Weight 49.9 kg  Will order cefazolin 2 g IV x1 for preop surgical ppx  Rayna Sexton, PharmD, BCPS Clinical Pharmacist 06/03/2020 8:45 AM

## 2020-06-03 NOTE — Anesthesia Postprocedure Evaluation (Signed)
Anesthesia Post Note  Patient: Shane Henderson  Procedure(s) Performed: L4/5 LAMINECTOMY WITH MET-RX (N/A )  Patient location during evaluation: PACU Anesthesia Type: General Level of consciousness: awake and alert Pain management: pain level controlled Vital Signs Assessment: post-procedure vital signs reviewed and stable Respiratory status: spontaneous breathing, nonlabored ventilation, respiratory function stable and patient connected to nasal cannula oxygen Cardiovascular status: blood pressure returned to baseline and stable Postop Assessment: no apparent nausea or vomiting Anesthetic complications: no   No complications documented.   Last Vitals:  Vitals:   06/03/20 1504 06/03/20 1508  BP: (!) 178/85 (!) 183/82  Pulse: 75   Resp: 16   Temp: (!) 36.2 C   SpO2: 100%     Last Pain:  Vitals:   06/03/20 1310  TempSrc: Temporal  PainSc: 0-No pain                 Arita Miss

## 2020-06-03 NOTE — Progress Notes (Signed)
Pt HR: 46. Dr. Bertell Maria notified. Acknowledged. No new orders at this time.

## 2020-06-04 ENCOUNTER — Encounter: Payer: Self-pay | Admitting: Neurosurgery

## 2020-06-04 NOTE — Discharge Summary (Signed)
Physician Discharge Summary  Patient ID: Shane Henderson MRN: [id number] DOB/AGE: Apr 21, 1946 74 y.o.  Admit date: 06/03/2020 Discharge date: 06/04/2020  Admission Diagnoses: Lumbar stenosis  Discharge Diagnoses:  Active Problems:   Spinal stenosis of lumbar region with radiculopathy   Discharged Condition: good  Hospital Course: Shane Henderson was admitted after undergoing a lumbar decompression at the L4-5 level on 8/9.  The surgery was uncomplicated and postoperative the patient was seen to be at his neurologic baseline.  He had had improved pain in his lower extremities and he was up ambulating.  In the postoperative area, he was unable to void spontaneously requiring intermittent catheterization.  Given this, he was admitted to the floor for observation.  There, after IV fluids and continued ambulation, he was able to void spontaneously.  He was then discharged home in a stable condition  Consults: None   Discharge Exam: Blood pressure (!) 185/82, pulse 77, temperature (!) 97.2 F (36.2 C), resp. rate 16, SpO2 100 %. 5/5 strength in bilateral lower extremities Sensation intact in lower extremities Clean dressing in the lower lumbar region  Disposition: Discharge disposition: 01-Home or Self Care       Discharge Instructions    Remove dressing in 24 hours   Complete by: As directed      Allergies as of 06/03/2020      Reactions   Iodinated Diagnostic Agents Rash      Medication List    TAKE these medications   acetaminophen 500 MG tablet Commonly known as: TYLENOL Take 1-2 tablets (500-1,000 mg total) by mouth every 6 (six) hours as needed for mild pain (no more than 4000mg  in 24 hours).   amiodarone 200 MG tablet Commonly known as: PACERONE Take 200 mg by mouth daily. What changed: Another medication with the same name was removed. Continue taking this medication, and follow the directions you see here.   carvedilol 6.25 MG tablet Commonly known as: COREG Take 6.25  mg by mouth at bedtime.   enalapril 10 MG tablet Commonly known as: VASOTEC Take 10 mg by mouth daily. Notes to patient: Not given this hospital stay   furosemide 20 MG tablet Commonly known as: LASIX Take 20 mg by mouth daily as needed for fluid.   methocarbamol 500 MG tablet Commonly known as: Robaxin Take 1 tablet (500 mg total) by mouth 4 (four) times daily as needed for muscle spasms.   senna 8.6 MG Tabs tablet Commonly known as: SENOKOT Take 1 tablet (8.6 mg total) by mouth 2 (two) times daily.   tamsulosin 0.4 MG Caps capsule Commonly known as: FLOMAX Take 1 capsule by mouth once daily Notes to patient: Not given this hosptial stay   traMADol 50 MG tablet Commonly known as: ULTRAM Take by mouth every 6 (six) hours as needed.       Follow-up Information    Deetta Perla, MD Follow up.   Specialty: Neurosurgery Why: follow up aug 20th at [facility] information: [place] [id number] [phone]               Signed: Deetta Perla 06/04/2020, 11:57 AM

## 2020-06-23 ENCOUNTER — Other Ambulatory Visit: Payer: Self-pay | Admitting: Internal Medicine

## 2020-07-23 ENCOUNTER — Other Ambulatory Visit: Payer: Self-pay

## 2020-07-23 ENCOUNTER — Ambulatory Visit (INDEPENDENT_AMBULATORY_CARE_PROVIDER_SITE_OTHER): Payer: Medicare HMO | Admitting: Internal Medicine

## 2020-07-23 ENCOUNTER — Encounter: Payer: Self-pay | Admitting: Internal Medicine

## 2020-07-23 VITALS — BP 138/70 | HR 67 | Ht 72.0 in | Wt 110.1 lb

## 2020-07-23 DIAGNOSIS — R6251 Failure to thrive (child): Secondary | ICD-10-CM | POA: Insufficient documentation

## 2020-07-23 DIAGNOSIS — F419 Anxiety disorder, unspecified: Secondary | ICD-10-CM | POA: Diagnosis not present

## 2020-07-23 DIAGNOSIS — R627 Adult failure to thrive: Secondary | ICD-10-CM

## 2020-07-23 DIAGNOSIS — Z87442 Personal history of urinary calculi: Secondary | ICD-10-CM | POA: Insufficient documentation

## 2020-07-23 DIAGNOSIS — M48061 Spinal stenosis, lumbar region without neurogenic claudication: Secondary | ICD-10-CM | POA: Diagnosis not present

## 2020-07-23 DIAGNOSIS — M5416 Radiculopathy, lumbar region: Secondary | ICD-10-CM

## 2020-07-23 DIAGNOSIS — Z720 Tobacco use: Secondary | ICD-10-CM | POA: Insufficient documentation

## 2020-07-23 NOTE — Assessment & Plan Note (Signed)
Patient was advised to drinks some Ensure every day  take vitamin D 1000 units daily

## 2020-07-23 NOTE — Assessment & Plan Note (Signed)
Patient has  endplate compression fracture with 35% height loss patient has been taking tramadol and ibuprofen.  He was referred back to the orthopedic surgeon.  He was advised to quit smoking completely.

## 2020-07-23 NOTE — Progress Notes (Signed)
Established Patient Office Visit  SUBJECTIVE:  Subjective  Patient ID: Shane Henderson, male    DOB: [birth year]  Age: 74 y.o. MRN: [id number]  CC:  Chief Complaint  Patient presents with  . Hip Pain    Patient has had xray and MRI of L hip but is requesting a second opinion from pcp     HPI Shane Henderson is a 74 y.o. male presenting today for a second opinion on recent scans.  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 He said that as he was lifting the equipment, he felt something in his left hip pop. He thought it was a strain and rested it some, but then he lifted another heavy object a few days later and strained himself more.   He states that he had xrays of his hip and was referred to an orthopedist. The night before he went to the orthopedist, he notes that he slipped and fell in the shower and landed on the edge of his tub.   An MRI of the lumbar spine was performed on 04/23/2020 revealing a subacute benign-appearing L5 superior endplate compression fracture with up to 35% height loss centrally and 7 mm retropulsion. Multilevel degenerative changes of the lumbar spine as described above, worst at L4-L5 where there is severe spinal canal and lateral recess stenosis.  He has been taking tramadol, tylenol, and ibuprofen for pain without much relief.   He notes that he got his COVID19 booster vaccine and flu vaccine yesterday.    Past Medical History:  Diagnosis Date  . Anxiety   . Dysrhythmia   . History of kidney stones   . Lumbar radiculopathy   . Lumbar stenosis   . Neuromuscular disorder [facility])     Past Surgical History:  Procedure Laterality Date  . KIDNEY STONE SURGERY    . LUMBAR LAMINECTOMY/ DECOMPRESSION WITH MET-RX N/A 06/03/2020   Procedure: L4/5 LAMINECTOMY WITH MET-RX;  Surgeon: Deetta Perla, MD;  Location: [facility] ORS;  Service: Neurosurgery;  Laterality: N/A;  . MITRAL VALVE REPAIR       History reviewed. No pertinent family history.  Social History   Socioeconomic History  . Marital status: Married    Spouse name: Not on file  . Number of children: Not on file  . Years of education: Not on file  . Highest education level: Not on file  Occupational History  . Not on file  Tobacco Use  . Smoking status: Never Smoker  . Smokeless tobacco: Current User    Types: Chew  Vaping Use  . Vaping Use: Never used  Substance and Sexual Activity  . Alcohol use: Yes    Comment: Beer  . Drug use: Never  . Sexual activity: Not on file  Other Topics Concern  . Not on file  Social History Narrative  . Not on file   Social Determinants of Health   Financial Resource Strain:   . Difficulty of Paying Living Expenses: Not on file  Food Insecurity:   . Worried About Charity fundraiser in the Last Year: Not on file  . Ran Out of Food in the Last Year: Not on file  Transportation Needs:   . Lack of Transportation (Medical): Not on file  . Lack of Transportation (Non-Medical): Not on file  Physical Activity:   . Days of Exercise per Week: Not on file  . Minutes of Exercise  per Session: Not on file  Stress:   . Feeling of Stress : Not on file  Social Connections:   . Frequency of Communication with Friends and Family: Not on file  . Frequency of Social Gatherings with Friends and Family: Not on file  . Attends Religious Services: Not on file  . Active Member of Clubs or Organizations: Not on file  . Attends Archivist Meetings: Not on file  . Marital Status: Not on file  Intimate Partner Violence:   . Fear of Current or Ex-Partner: Not on file  . Emotionally Abused: Not on file  . Physically Abused: Not on file  . Sexually Abused: Not on file     Current Outpatient Medications:  .  acetaminophen (TYLENOL) 500 MG tablet, Take 1-2 tablets (500-1,000 mg total) by mouth every 6 (six) hours as needed for mild pain (no more than 4066m in 24 hours)., Disp:  30 tablet, Rfl: 0 .  amiodarone (PACERONE) 200 MG tablet, Take 200 mg by mouth daily., Disp: , Rfl:  .  carvedilol (COREG) 6.25 MG tablet, Take 6.25 mg by mouth at bedtime. , Disp: , Rfl:  .  enalapril (VASOTEC) 10 MG tablet, Take 10 mg by mouth daily., Disp: , Rfl:  .  furosemide (LASIX) 20 MG tablet, Take 20 mg by mouth daily as needed for fluid., Disp: , Rfl:  .  methocarbamol (ROBAXIN) 500 MG tablet, Take 1 tablet (500 mg total) by mouth 4 (four) times daily as needed for muscle spasms., Disp: 60 tablet, Rfl: 0 .  senna (SENOKOT) 8.6 MG TABS tablet, Take 1 tablet (8.6 mg total) by mouth 2 (two) times daily., Disp: 120 tablet, Rfl: 0 .  tamsulosin (FLOMAX) 0.4 MG CAPS capsule, Take 1 capsule by mouth once daily, Disp: 30 capsule, Rfl: 0 .  traMADol (ULTRAM) 50 MG tablet, Take by mouth every 6 (six) hours as needed., Disp: , Rfl:    Allergies  Allergen Reactions  . Iodinated Diagnostic Agents Rash    ROS Review of Systems  Constitutional: Negative.   HENT: Negative.   Eyes: Negative.   Respiratory: Negative.   Cardiovascular: Negative.   Gastrointestinal: Negative.   Endocrine: Negative.   Genitourinary: Negative.   Musculoskeletal: Negative for back pain.       + Left Hip Pain, radiating down L leg to foot  Skin: Negative.   Allergic/Immunologic: Negative.   Neurological: Negative.   Hematological: Negative.   Psychiatric/Behavioral: Negative.   All other systems reviewed and are negative.    OBJECTIVE:    Physical Exam Vitals reviewed.  Constitutional:      Appearance: Normal appearance.  HENT:     Mouth/Throat:     Mouth: Mucous membranes are moist.  Eyes:     Pupils: Pupils are equal, round, and reactive to light.  Neck:     Vascular: No carotid bruit.  Cardiovascular:     Rate and Rhythm: Normal rate and regular rhythm.     Pulses: Normal pulses.     Heart sounds: Murmur heard.  Systolic murmur is present with a grade of 2/6.   Pulmonary:     Effort:  Pulmonary effort is normal.     Breath sounds: Normal breath sounds.  Abdominal:     General: Bowel sounds are normal.     Palpations: Abdomen is soft. There is no hepatomegaly, splenomegaly or mass.     Tenderness: There is no abdominal tenderness.     Hernia: No hernia is present.  Musculoskeletal:     Cervical back: Neck supple.     Right lower leg: No edema.     Left lower leg: No edema.  Skin:    Findings: No rash.  Neurological:     Mental Status: He is alert and oriented to person, place, and time.     Motor: No weakness.  Psychiatric:        Mood and Affect: Mood normal.        Behavior: Behavior normal.     BP 138/70   Pulse 67   Ht 6' (1.829 m)   Wt 110 lb 1.6 oz (49.9 kg)   BMI 14.93 kg/m  Wt Readings from Last 3 Encounters:  07/23/20 110 lb 1.6 oz (49.9 kg)  05/27/20 110 lb (49.9 kg)    Health Maintenance Due  Topic Date Due  . Hepatitis C Screening  Never done  . COVID-19 Vaccine (1) Never done  . TETANUS/TDAP  Never done  . PNA vac Low Risk Adult (1 of 2 - PCV13) Never done  . INFLUENZA VACCINE  Never done    There are no preventive care reminders to display for this patient.  CBC Latest Ref Rng & Units 05/27/2020  WBC 4.0 - 10.5 K/uL 6.3  Hemoglobin 13.0 - 17.0 g/dL 12.2(L)  Hematocrit 39 - 52 % 34.4(L)  Platelets 150 - 400 K/uL 213   CMP Latest Ref Rng & Units 05/27/2020  Glucose 70 - 99 mg/dL 108(H)  BUN 8 - 23 mg/dL 13  Creatinine 0.61 - 1.24 mg/dL 0.78  Sodium 135 - 145 mmol/L 130(L)  Potassium 3.5 - 5.1 mmol/L 3.4(L)  Chloride 98 - 111 mmol/L 94(L)  CO2 22 - 32 mmol/L 29  Calcium 8.9 - 10.3 mg/dL 8.4(L)    No results found for: TSH Lab Results  Component Value Date   ANIONGAP 7 05/27/2020   No results found for: CHOL, HDL, LDLCALC, CHOLHDL No results found for: TRIG No results found for: HGBA1C    ASSESSMENT & PLAN:   Problem List Items Addressed This Visit      Nervous and Auditory   Spinal stenosis of lumbar region with  radiculopathy - Primary    Patient has  endplate compression fracture with 35% height loss patient has been taking tramadol and ibuprofen.  He was referred back to the orthopedic surgeon.  He was advised to quit smoking completely.        Other   History of kidney stones    There is no further episode of renal colic.  He denies any history of hematuria or urinary tract infection.  Patient was advised to follow-up with kidney specialist      Anxiety    - Patient experiencing high levels of anxiety.  - Encouraged patient to engage in relaxing activities like yoga, meditation, journaling, going for a walk, or participating in a hobby.  - Encouraged patient to reach out to trusted friends or family members about recent struggles       Failure to gain weight (0-17)    Patient was advised to drinks some Ensure every day  take vitamin D 1000 units daily      Tobacco abuse    - I instructed the patient to stop smoking and provided them with smoking cessation materials.  - I informed the patient that smoking puts them at increased risk for cancer, COPD, hypertension, and more.  - Informed the patient to seek help if they begin to have trouble breathing,  develop chest pain, start to cough up blood, feel faint, or pass out.           No orders of the defined types were placed in this encounter.   Follow-up: Return in about 2 weeks (around 08/06/2020).    Cletis Athens, MD [facility] [address], [place], [place] [address]   By signing my name below, I, [employer], attest that this documentation has been prepared under the direction and in the presence of Dr. Cletis Athens Electronically Signed: Cletis Athens, MD 07/23/20, 6:18 PM  I personally performed the services described in this documentation, which was SCRIBED in my presence. The recorded information has been reviewed and considered accurate. It has been edited as necessary during review. Cletis Athens, MD

## 2020-07-23 NOTE — Assessment & Plan Note (Signed)
There is no further episode of renal colic.  He denies any history of hematuria or urinary tract infection.  Patient was advised to follow-up with kidney specialist

## 2020-07-23 NOTE — Assessment & Plan Note (Signed)
-   I instructed the patient to stop smoking and provided them with smoking cessation materials.  - I informed the patient that smoking puts them at increased risk for cancer, COPD, hypertension, and more.  - Informed the patient to seek help if they begin to have trouble breathing, develop chest pain, start to cough up blood, feel faint, or pass out.  

## 2020-07-23 NOTE — Assessment & Plan Note (Signed)
-   Patient experiencing high levels of anxiety.  - Encouraged patient to engage in relaxing activities like yoga, meditation, journaling, going for a walk, or participating in a hobby.  - Encouraged patient to reach out to trusted friends or family members about recent struggles 

## 2020-07-24 ENCOUNTER — Other Ambulatory Visit: Payer: Self-pay | Admitting: Internal Medicine

## 2020-08-06 ENCOUNTER — Ambulatory Visit: Payer: Medicare HMO | Admitting: Internal Medicine

## 2020-08-26 ENCOUNTER — Other Ambulatory Visit: Payer: Self-pay | Admitting: Internal Medicine

## 2020-09-23 ENCOUNTER — Other Ambulatory Visit: Payer: Self-pay | Admitting: Internal Medicine

## 2020-10-31 ENCOUNTER — Other Ambulatory Visit: Payer: Self-pay | Admitting: Internal Medicine

## 2020-11-28 ENCOUNTER — Other Ambulatory Visit: Payer: Self-pay | Admitting: Family Medicine

## 2020-12-24 DIAGNOSIS — I4891 Unspecified atrial fibrillation: Secondary | ICD-10-CM | POA: Diagnosis not present

## 2020-12-24 DIAGNOSIS — I351 Nonrheumatic aortic (valve) insufficiency: Secondary | ICD-10-CM | POA: Diagnosis not present

## 2020-12-24 DIAGNOSIS — R0602 Shortness of breath: Secondary | ICD-10-CM | POA: Diagnosis not present

## 2020-12-24 DIAGNOSIS — I2510 Atherosclerotic heart disease of native coronary artery without angina pectoris: Secondary | ICD-10-CM | POA: Diagnosis not present

## 2020-12-24 DIAGNOSIS — I10 Essential (primary) hypertension: Secondary | ICD-10-CM | POA: Diagnosis not present

## 2020-12-24 DIAGNOSIS — I340 Nonrheumatic mitral (valve) insufficiency: Secondary | ICD-10-CM | POA: Diagnosis not present

## 2020-12-31 ENCOUNTER — Other Ambulatory Visit: Payer: Self-pay | Admitting: Family Medicine

## 2021-01-23 DIAGNOSIS — S82841E Displaced bimalleolar fracture of right lower leg, subsequent encounter for open fracture type I or II with routine healing: Secondary | ICD-10-CM | POA: Diagnosis not present

## 2021-01-23 DIAGNOSIS — S82891E Other fracture of right lower leg, subsequent encounter for open fracture type I or II with routine healing: Secondary | ICD-10-CM | POA: Diagnosis not present

## 2021-01-23 DIAGNOSIS — W11XXXD Fall on and from ladder, subsequent encounter: Secondary | ICD-10-CM | POA: Diagnosis not present

## 2021-02-03 ENCOUNTER — Other Ambulatory Visit: Payer: Self-pay | Admitting: Internal Medicine

## 2021-02-10 DIAGNOSIS — S82891E Other fracture of right lower leg, subsequent encounter for open fracture type I or II with routine healing: Secondary | ICD-10-CM | POA: Diagnosis not present

## 2021-02-10 DIAGNOSIS — M25551 Pain in right hip: Secondary | ICD-10-CM | POA: Diagnosis not present

## 2021-02-18 DIAGNOSIS — M25551 Pain in right hip: Secondary | ICD-10-CM | POA: Diagnosis not present

## 2021-02-18 DIAGNOSIS — S82891E Other fracture of right lower leg, subsequent encounter for open fracture type I or II with routine healing: Secondary | ICD-10-CM | POA: Diagnosis not present

## 2021-02-26 DIAGNOSIS — M25551 Pain in right hip: Secondary | ICD-10-CM | POA: Diagnosis not present

## 2021-02-26 DIAGNOSIS — S82891E Other fracture of right lower leg, subsequent encounter for open fracture type I or II with routine healing: Secondary | ICD-10-CM | POA: Diagnosis not present

## 2021-03-05 ENCOUNTER — Other Ambulatory Visit: Payer: Self-pay | Admitting: Internal Medicine

## 2021-03-05 DIAGNOSIS — S82891E Other fracture of right lower leg, subsequent encounter for open fracture type I or II with routine healing: Secondary | ICD-10-CM | POA: Diagnosis not present

## 2021-03-05 DIAGNOSIS — M25551 Pain in right hip: Secondary | ICD-10-CM | POA: Diagnosis not present

## 2021-03-12 DIAGNOSIS — S82891E Other fracture of right lower leg, subsequent encounter for open fracture type I or II with routine healing: Secondary | ICD-10-CM | POA: Diagnosis not present

## 2021-03-12 DIAGNOSIS — M25551 Pain in right hip: Secondary | ICD-10-CM | POA: Diagnosis not present

## 2021-04-04 ENCOUNTER — Other Ambulatory Visit: Payer: Self-pay | Admitting: Internal Medicine

## 2021-05-04 ENCOUNTER — Other Ambulatory Visit: Payer: Self-pay | Admitting: Internal Medicine

## 2021-05-26 ENCOUNTER — Ambulatory Visit (INDEPENDENT_AMBULATORY_CARE_PROVIDER_SITE_OTHER): Payer: Medicare HMO | Admitting: Internal Medicine

## 2021-05-26 ENCOUNTER — Encounter: Payer: Self-pay | Admitting: Internal Medicine

## 2021-05-26 ENCOUNTER — Other Ambulatory Visit: Payer: Self-pay

## 2021-05-26 VITALS — BP 151/83 | HR 82 | Ht 72.0 in | Wt 116.4 lb

## 2021-05-26 DIAGNOSIS — Z720 Tobacco use: Secondary | ICD-10-CM

## 2021-05-26 DIAGNOSIS — R6251 Failure to thrive (child): Secondary | ICD-10-CM

## 2021-05-26 DIAGNOSIS — Z952 Presence of prosthetic heart valve: Secondary | ICD-10-CM | POA: Insufficient documentation

## 2021-05-26 DIAGNOSIS — F419 Anxiety disorder, unspecified: Secondary | ICD-10-CM

## 2021-05-26 DIAGNOSIS — M48061 Spinal stenosis, lumbar region without neurogenic claudication: Secondary | ICD-10-CM

## 2021-05-26 DIAGNOSIS — F1010 Alcohol abuse, uncomplicated: Secondary | ICD-10-CM

## 2021-05-26 DIAGNOSIS — Z0000 Encounter for general adult medical examination without abnormal findings: Secondary | ICD-10-CM

## 2021-05-26 DIAGNOSIS — Z87442 Personal history of urinary calculi: Secondary | ICD-10-CM | POA: Diagnosis not present

## 2021-05-26 DIAGNOSIS — K5904 Chronic idiopathic constipation: Secondary | ICD-10-CM | POA: Diagnosis not present

## 2021-05-26 DIAGNOSIS — M5416 Radiculopathy, lumbar region: Secondary | ICD-10-CM | POA: Diagnosis not present

## 2021-05-26 NOTE — Assessment & Plan Note (Signed)
Drink Ensure 1 can twice a day.  #2 stop chewing tobacco #3 stop drinking alcohol

## 2021-05-26 NOTE — Assessment & Plan Note (Signed)
Chest is clear, heart no no murmur noted.

## 2021-05-26 NOTE — Assessment & Plan Note (Signed)
Rectal examination does not show any bleeding stool negative for blood.  Prostate is not enlarged.  Patient will be referred to GI specialist for evaluation.  He was advised to take MiraLAX 1 scoop a day.  Also advised to take Ensure 1 can twice a day.

## 2021-05-26 NOTE — Assessment & Plan Note (Signed)
-   Patient experiencing high levels of anxiety.  - Encouraged patient to engage in relaxing activities like yoga, meditation, journaling, going for a walk, or participating in a hobby.  - Encouraged patient to reach out to trusted friends or family members about recent struggles, Patient was advised to read A book, how to stop worrying and start living, it is good book to read to control  the stress  

## 2021-05-26 NOTE — Progress Notes (Signed)
New Patient Office Visit  Subjective:  Patient ID: Shane Henderson NYCE, male    DOB: 1946/05/08  Age: 75 y.o. MRN: [id number]  CC:  Chief Complaint  Patient presents with   Annual Exam    HPI Patient presents for physical/patient is a problem with constipation, his last a lot of weight, he has a history of mitral valve replacement at [facility].  He chews tobacco drinks 3 to 4 cans of beer a day.  He also complains of gas and bloating and constipation.  He was sent to gastroenterologist for evaluation.  We will also do a CBC and metabolic panel.  He was advised to drink Ensure 1 can twice a day.  Past Medical History:  Diagnosis Date   Anxiety    Dysrhythmia    History of kidney stones    Lumbar radiculopathy    Lumbar stenosis    Neuromuscular disorder (HCC)      Current Outpatient Medications:    amiodarone (PACERONE) 200 MG tablet, Take 200 mg by mouth daily., Disp: , Rfl:    carvedilol (COREG) 6.25 MG tablet, Take 6.25 mg by mouth at bedtime. , Disp: , Rfl:    enalapril (VASOTEC) 10 MG tablet, Take 10 mg by mouth daily., Disp: , Rfl:    tamsulosin (FLOMAX) 0.4 MG CAPS capsule, Take 1 capsule by mouth once daily, Disp: 30 capsule, Rfl: 0   Past Surgical History:  Procedure Laterality Date   KIDNEY STONE SURGERY     LUMBAR LAMINECTOMY/ DECOMPRESSION WITH MET-RX N/A 06/03/2020   Procedure: L4/5 LAMINECTOMY WITH MET-RX;  Surgeon: Deetta Perla, MD;  Location: [facility] ORS;  Service: Neurosurgery;  Laterality: N/A;   MITRAL VALVE REPAIR      History reviewed. No pertinent family history.  Social History   Socioeconomic History   Marital status: Married    Spouse name: Not on file   Number of children: Not on file   Years of education: Not on file   Highest education level: Not on file  Occupational History   Not on file  Tobacco Use   Smoking status: Never   Smokeless tobacco: Current    Types: Chew  Vaping Use   Vaping Use: Never used  Substance and Sexual Activity    Alcohol use: Yes    Comment: Beer   Drug use: Never   Sexual activity: Not on file  Other Topics Concern   Not on file  Social History Narrative   Not on file   Social Determinants of Health   Financial Resource Strain: Not on file  Food Insecurity: Not on file  Transportation Needs: Not on file  Physical Activity: Not on file  Stress: Not on file  Social Connections: Not on file  Intimate Partner Violence: Not on file    ROS Review of Systems  Constitutional: Negative.   HENT: Negative.    Eyes: Negative.   Respiratory: Negative.  Negative for cough and wheezing.   Cardiovascular: Negative.   Gastrointestinal:  Positive for constipation. Negative for abdominal distention, abdominal pain, anal bleeding and blood in stool.  Endocrine: Negative.   Genitourinary: Negative.  Negative for flank pain.  Musculoskeletal:  Positive for arthralgias and myalgias. Negative for gait problem.  Skin: Negative.   Allergic/Immunologic: Negative.   Neurological: Negative.  Negative for seizures.  Hematological: Negative.   Psychiatric/Behavioral: Negative.    All other systems reviewed and are negative.  Objective:   Today's Vitals: BP (!) 151/83   Pulse 82  Ht 6' (1.829 m)   Wt 116 lb 6.4 oz (52.8 kg)   BMI 15.79 kg/m   Physical Exam Constitutional:      Appearance: He is ill-appearing.  HENT:     Head: Normocephalic.     Right Ear: Tympanic membrane normal.     Left Ear: Tympanic membrane normal.     Nose: Nose normal.  Eyes:     Pupils: Pupils are equal, round, and reactive to light.  Cardiovascular:     Rate and Rhythm: Normal rate and regular rhythm.     Pulses:          Carotid pulses are 2+ on the right side and 2+ on the left side.      Radial pulses are 2+ on the right side and 2+ on the left side.       Femoral pulses are 2+ on the right side and 2+ on the left side.      Popliteal pulses are 2+ on the right side and 2+ on the left side.       Dorsalis pedis  pulses are 2+ on the right side and 2+ on the left side.       Posterior tibial pulses are 2+ on the right side and 2+ on the left side.     Comments: Status post bypass surgery, status post mitral valve replacement, unclear whether he has a coronary artery bypass or not Genitourinary:    Comments: Prostate is normal no blood was seen in the rectal examination, sphincter tone is normal Musculoskeletal:     Cervical back: Normal range of motion.  Neurological:     Mental Status: He is alert.    Assessment & Plan:   Problem List Items Addressed This Visit       Digestive   Chronic idiopathic constipation    Rectal examination does not show any bleeding stool negative for blood.  Prostate is not enlarged.  Patient will be referred to GI specialist for evaluation.  He was advised to take MiraLAX 1 scoop a day.  Also advised to take Ensure 1 can twice a day.         Nervous and Auditory   Spinal stenosis of lumbar region with radiculopathy - Primary     Other   History of kidney stones   Anxiety    - Patient experiencing high levels of anxiety.  - Encouraged patient to engage in relaxing activities like yoga, meditation, journaling, going for a walk, or participating in a hobby.  - Encouraged patient to reach out to trusted friends or family members about recent struggles, Patient was advised to read A book, how to stop worrying and start living, it is good book to read to control  the stress        Failure to gain weight (0-17)    Drink Ensure 1 can twice a day.  #2 stop chewing tobacco #3 stop drinking alcohol       Tobacco abuse    - I instructed the patient to stop smoking and provided them with smoking cessation materials.  - I informed the patient that smoking puts them at increased risk for cancer, COPD, hypertension, and more.  - Informed the patient to seek help if they begin to have trouble breathing, develop chest pain, start to cough up blood, feel faint, or pass  out.       Alcohol abuse    Stop drinking alcohol  History of MVR with cardiopulmonary bypass    Chest is clear, heart no no murmur noted.        Outpatient Encounter Medications as of 05/26/2021  Medication Sig   amiodarone (PACERONE) 200 MG tablet Take 200 mg by mouth daily.   carvedilol (COREG) 6.25 MG tablet Take 6.25 mg by mouth at bedtime.    enalapril (VASOTEC) 10 MG tablet Take 10 mg by mouth daily.   tamsulosin (FLOMAX) 0.4 MG CAPS capsule Take 1 capsule by mouth once daily   [DISCONTINUED] acetaminophen (TYLENOL) 500 MG tablet Take 1-2 tablets (500-1,000 mg total) by mouth every 6 (six) hours as needed for mild pain (no more than 4000mg in 24 hours). (Patient not taking: Reported on 05/26/2021)   [DISCONTINUED] furosemide (LASIX) 20 MG tablet Take 20 mg by mouth daily as needed for fluid. (Patient not taking: Reported on 05/26/2021)   [DISCONTINUED] methocarbamol (ROBAXIN) 500 MG tablet Take 1 tablet (500 mg total) by mouth 4 (four) times daily as needed for muscle spasms. (Patient not taking: Reported on 05/26/2021)   [DISCONTINUED] senna (SENOKOT) 8.6 MG TABS tablet Take 1 tablet (8.6 mg total) by mouth 2 (two) times daily. (Patient not taking: Reported on 05/26/2021)   [DISCONTINUED] traMADol (ULTRAM) 50 MG tablet Take by mouth every 6 (six) hours as needed. (Patient not taking: Reported on 05/26/2021)   No facility-administered encounter medications on file as of 05/26/2021.    Follow-up: No follow-ups on file.   Javed Masoud, MD  

## 2021-05-26 NOTE — Assessment & Plan Note (Signed)
Stop drinking alcohol!! °

## 2021-05-26 NOTE — Assessment & Plan Note (Signed)
-   I instructed the patient to stop smoking and provided them with smoking cessation materials.  - I informed the patient that smoking puts them at increased risk for cancer, COPD, hypertension, and more.  - Informed the patient to seek help if they begin to have trouble breathing, develop chest pain, start to cough up blood, feel faint, or pass out.  

## 2021-05-27 LAB — COMPLETE METABOLIC PANEL WITH GFR
AG Ratio: 1.4 (calc) (ref 1.0–2.5)
ALT: 11 U/L (ref 9–46)
AST: 23 U/L (ref 10–35)
Albumin: 4 g/dL (ref 3.6–5.1)
Alkaline phosphatase (APISO): 102 U/L (ref 35–144)
BUN: 8 mg/dL (ref 7–25)
CO2: 20 mmol/L (ref 20–32)
Calcium: 8.6 mg/dL (ref 8.6–10.3)
Chloride: 98 mmol/L (ref 98–110)
Creat: 0.79 mg/dL (ref 0.70–1.28)
Globulin: 2.8 g/dL (calc) (ref 1.9–3.7)
Glucose, Bld: 76 mg/dL (ref 65–99)
Potassium: 4.2 mmol/L (ref 3.5–5.3)
Sodium: 133 mmol/L — ABNORMAL LOW (ref 135–146)
Total Bilirubin: 0.5 mg/dL (ref 0.2–1.2)
Total Protein: 6.8 g/dL (ref 6.1–8.1)
eGFR: 91 mL/min/{1.73_m2} (ref >=60)

## 2021-05-27 LAB — CBC WITH DIFFERENTIAL/PLATELET
Absolute Monocytes: 688 cells/uL (ref 200–950)
Basophils Absolute: 22 cells/uL (ref 0–200)
Basophils Relative: 0.4 %
Eosinophils Absolute: 28 cells/uL (ref 15–500)
Eosinophils Relative: 0.5 %
HCT: 35.1 % — ABNORMAL LOW (ref 38.5–50.0)
Hemoglobin: 11.3 g/dL — ABNORMAL LOW (ref 13.2–17.1)
Lymphs Abs: 2002 cells/uL (ref 850–3900)
MCH: 26.6 pg — ABNORMAL LOW (ref 27.0–33.0)
MCHC: 32.2 g/dL (ref 32.0–36.0)
MCV: 82.6 fL (ref 80.0–100.0)
MPV: 10.1 fL (ref 7.5–12.5)
Monocytes Relative: 12.5 %
Neutro Abs: 2761 cells/uL (ref 1500–7800)
Neutrophils Relative %: 50.2 %
Platelets: 224 10*3/uL (ref 140–400)
RBC: 4.25 10*6/uL (ref 4.20–5.80)
RDW: 15.2 % — ABNORMAL HIGH (ref 11.0–15.0)
Total Lymphocyte: 36.4 %
WBC: 5.5 10*3/uL (ref 3.8–10.8)

## 2021-05-27 LAB — TSH: TSH: 3.65 mIU/L (ref 0.40–4.50)

## 2021-05-27 LAB — PSA: PSA: 0.52 ng/mL (ref <=4.00)

## 2021-05-30 ENCOUNTER — Other Ambulatory Visit: Payer: Self-pay

## 2021-05-30 DIAGNOSIS — R6251 Failure to thrive (child): Secondary | ICD-10-CM

## 2021-06-03 ENCOUNTER — Other Ambulatory Visit: Payer: Self-pay | Admitting: Internal Medicine

## 2021-06-03 ENCOUNTER — Other Ambulatory Visit: Payer: Self-pay

## 2021-06-03 DIAGNOSIS — R6251 Failure to thrive (child): Secondary | ICD-10-CM

## 2021-06-04 DIAGNOSIS — R194 Change in bowel habit: Secondary | ICD-10-CM | POA: Diagnosis not present

## 2021-06-04 DIAGNOSIS — R634 Abnormal weight loss: Secondary | ICD-10-CM | POA: Diagnosis not present

## 2021-06-10 ENCOUNTER — Ambulatory Visit: Payer: Medicare HMO | Admitting: Internal Medicine

## 2021-06-11 ENCOUNTER — Encounter: Payer: Self-pay | Admitting: *Deleted

## 2021-06-12 ENCOUNTER — Encounter: Payer: Self-pay | Admitting: Anesthesiology

## 2021-06-12 ENCOUNTER — Other Ambulatory Visit: Payer: Self-pay

## 2021-06-12 ENCOUNTER — Encounter
Admission: RE | Disposition: A | Discharge status: Home / Self Care | Payer: Self-pay | Source: Home / Self Care | Attending: Gastroenterology

## 2021-06-12 ENCOUNTER — Ambulatory Visit
Admission: RE | Admit: 2021-06-12 | Discharge: 2021-06-12 | Disposition: A | Discharge status: Home / Self Care | Payer: Medicare HMO | Attending: Gastroenterology | Admitting: Gastroenterology

## 2021-06-12 ENCOUNTER — Ambulatory Visit: Payer: Medicare HMO | Admitting: Anesthesiology

## 2021-06-12 DIAGNOSIS — K5900 Constipation, unspecified: Secondary | ICD-10-CM | POA: Diagnosis not present

## 2021-06-12 DIAGNOSIS — R634 Abnormal weight loss: Secondary | ICD-10-CM | POA: Diagnosis not present

## 2021-06-12 DIAGNOSIS — F419 Anxiety disorder, unspecified: Secondary | ICD-10-CM | POA: Diagnosis not present

## 2021-06-12 DIAGNOSIS — D128 Benign neoplasm of rectum: Secondary | ICD-10-CM | POA: Diagnosis not present

## 2021-06-12 DIAGNOSIS — Z7982 Long term (current) use of aspirin: Secondary | ICD-10-CM | POA: Diagnosis not present

## 2021-06-12 DIAGNOSIS — R140 Abdominal distension (gaseous): Secondary | ICD-10-CM | POA: Insufficient documentation

## 2021-06-12 DIAGNOSIS — K649 Unspecified hemorrhoids: Secondary | ICD-10-CM | POA: Diagnosis not present

## 2021-06-12 DIAGNOSIS — Z79899 Other long term (current) drug therapy: Secondary | ICD-10-CM | POA: Diagnosis not present

## 2021-06-12 DIAGNOSIS — K5730 Diverticulosis of large intestine without perforation or abscess without bleeding: Secondary | ICD-10-CM | POA: Diagnosis not present

## 2021-06-12 DIAGNOSIS — R1032 Left lower quadrant pain: Secondary | ICD-10-CM | POA: Insufficient documentation

## 2021-06-12 DIAGNOSIS — K640 First degree hemorrhoids: Secondary | ICD-10-CM | POA: Diagnosis not present

## 2021-06-12 DIAGNOSIS — K635 Polyp of colon: Secondary | ICD-10-CM | POA: Diagnosis not present

## 2021-06-12 DIAGNOSIS — Z681 Body mass index (BMI) 19 or less, adult: Secondary | ICD-10-CM | POA: Diagnosis not present

## 2021-06-12 DIAGNOSIS — Z91041 Radiographic dye allergy status: Secondary | ICD-10-CM | POA: Diagnosis not present

## 2021-06-12 HISTORY — DX: Constipation, unspecified: K59.00

## 2021-06-12 HISTORY — PX: COLONOSCOPY WITH PROPOFOL: SHX5780

## 2021-06-12 SURGERY — COLONOSCOPY WITH PROPOFOL
Anesthesia: General

## 2021-06-12 MED ORDER — PROPOFOL 500 MG/50ML IV EMUL
INTRAVENOUS | Status: AC
  Filled 2021-06-12: qty 50

## 2021-06-12 MED ORDER — PROPOFOL 500 MG/50ML IV EMUL
INTRAVENOUS | Status: DC | PRN
  Administered 2021-06-12: 120 ug/kg/min via INTRAVENOUS

## 2021-06-12 MED ORDER — SODIUM CHLORIDE 0.9 % IV SOLN: INTRAVENOUS | Status: DC

## 2021-06-12 NOTE — Anesthesia Preprocedure Evaluation (Signed)
Anesthesia Evaluation  Patient identified by MRN, date of birth, ID band Patient awake    Reviewed: Allergy & Precautions, NPO status , Patient's Chart, lab work & pertinent test results, reviewed documented beta blocker date and time   History of Anesthesia Complications Negative for: history of anesthetic complications  Airway Mallampati: I  TM Distance: >3 FB Neck ROM: Full    Dental  (+) Poor Dentition, Dental Advisory Given   Pulmonary neg pulmonary ROS, neg sleep apnea, neg COPD, Patient abstained from smoking.Not current smoker,    Pulmonary exam normal breath sounds clear to auscultation       Cardiovascular Exercise Tolerance: Good METShypertension, Pt. on medications and Pt. on home beta blockers +CHF  (-) CAD and (-) Past MI + dysrhythmias Atrial Fibrillation + Valvular Problems/Murmurs AI and MR  Rhythm:Regular Rate:Normal - Systolic murmurs Works on his farm/garden, physically active. ECHOCARDIOGRAM done on 05/29/2020 1. LVEF 45-50% 2. Severe bilateral enlargement with ventricles and aorta appearing normal in size 3. Mild LV systolic dysfunction 4. RV systolic dysfunction 5. Normal LV wall motion 6. Abnormal RV wall motion 7. Mild LVH with grade 1 (relaxation abnormality) diastolic dysfunction 8. RV diastolic dysfunction 9. Mild pulmonary regurgitation 10. Moderate tricuspid regurgitation 11. Mild pulmonary hypertension 12. 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 Moderate to severe aortic regurgitation 14. No pericardial effusion 15. Intra-atrial septal wall appears aneurysmal without evidence of shunting  Evaluated by cardiology last week, no further optimization deemed necessary   Neuro/Psych PSYCHIATRIC DISORDERS Anxiety  Neuromuscular disease    GI/Hepatic neg GERD  ,(+)     (-) substance abuse  , Uses chewing tobacco   Endo/Other  neg diabetes  Renal/GU negative Renal ROS      Musculoskeletal  (+) Arthritis ,   Abdominal   Peds  Hematology   Anesthesia Other Findings Past Medical History: No date: Anxiety No date: Dysrhythmia No date: History of kidney stones No date: Lumbar radiculopathy No date: Lumbar stenosis No date: Neuromuscular disorder (HCC)  Reproductive/Obstetrics                             Anesthesia Physical  Anesthesia Plan  ASA: 3  Anesthesia Plan: General   Post-op Pain Management:    Induction: Intravenous  PONV Risk Score and Plan: 3 and Propofol infusion and TIVA  Airway Management Planned: Nasal Cannula and Natural Airway  Additional Equipment: None  Intra-op Plan:   Post-operative Plan:   Informed Consent: I have reviewed the patients History and Physical, chart, labs and discussed the procedure including the risks, benefits and alternatives for the proposed anesthesia with the patient or authorized representative who has indicated his/her understanding and acceptance.     Dental advisory given  Plan Discussed with: CRNA, Surgeon and Anesthesiologist  Anesthesia Plan Comments: (Discussed risks of anesthesia with patient, including PONV, sore throat, lip/dental damage. Rare risks discussed as well, such as cardiorespiratory and neurological sequelae. Patient understands.)        Anesthesia Quick Evaluation

## 2021-06-12 NOTE — Transfer of Care (Signed)
Immediate Anesthesia Transfer of Care Note  Patient: Shane Henderson  Procedure(s) Performed: COLONOSCOPY WITH PROPOFOL  Patient Location: PACU  Anesthesia Type:General  Level of Consciousness: awake and sedated  Airway & Oxygen Therapy: Patient Spontanous Breathing and Patient connected to nasal cannula oxygen  Post-op Assessment: Report given to RN and Post -op Vital signs reviewed and stable  Post vital signs: Reviewed and stable  Last Vitals:  Vitals Value Taken Time  BP 106/64 06/12/21 1029  Temp    Pulse 63 06/12/21 1031  Resp 14 06/12/21 1031  SpO2 100 % 06/12/21 1031  Vitals shown include unvalidated device data.  Last Pain:  Vitals:   06/12/21 1029  TempSrc:   PainSc: 0-No pain         Complications: No notable events documented.

## 2021-06-12 NOTE — H&P (Signed)
[facility] Pre-Procedure H&P   Patient ID: Shane Henderson is a 75 y.o. male.  Gastroenterology Provider: Annamaria Helling, DO  Referring Provider:  Stephens November, NP PCP: Cletis Athens, MD  Date: 06/12/2021  HPI Mr. Shane Henderson is a 75 y.o. male who presents today for Colonoscopy for bowel habit changes, constipation, weight loss.  Has had ongoing constipation since on pain medication, but now off. Still having issues with constipation. No blood in stool. Notes bloating and abdominal distention as time passes in between BM. Last hgb is 11.3.  No fhx of colon cancer or polyps  Undergoing colonoscopy today.  Past Medical History:  Diagnosis Date   Anxiety    Constipation    Dysrhythmia    History of kidney stones    Lumbar radiculopathy    Lumbar stenosis    Neuromuscular disorder [facility])     Past Surgical History:  Procedure Laterality Date   KIDNEY STONE SURGERY     LUMBAR LAMINECTOMY/ DECOMPRESSION WITH MET-RX N/A 06/03/2020   Procedure: L4/5 LAMINECTOMY WITH MET-RX;  Surgeon: Deetta Perla, MD;  Location: [facility] ORS;  Service: Neurosurgery;  Laterality: N/A;   MITRAL VALVE REPAIR      Family History No h/o GI disease or malignancy  Review of Systems  Constitutional:  Positive for unexpected weight change. Negative for activity change, appetite change, chills, fatigue and fever.  HENT:  Negative for trouble swallowing and voice change.   Respiratory:  Negative for shortness of breath.   Cardiovascular:  Negative for chest pain and palpitations.  Gastrointestinal:  Positive for abdominal pain and constipation. Negative for abdominal distention, anal bleeding, blood in stool, diarrhea, nausea and vomiting.  Musculoskeletal:  Negative for arthralgias and myalgias.  Skin:  Negative for color change and pallor.  Neurological:  Negative for dizziness, syncope and weakness.  Psychiatric/Behavioral:  Negative for confusion. The patient is not  nervous/anxious.   All other systems reviewed and are negative.   Medications No current facility-administered medications on file prior to encounter.   Current Outpatient Medications on File Prior to Encounter  Medication Sig Dispense Refill   amiodarone (PACERONE) 200 MG tablet Take 200 mg by mouth daily.     aspirin 81 MG chewable tablet Chew by mouth daily.     carvedilol (COREG) 6.25 MG tablet Take 6.25 mg by mouth at bedtime.      enalapril (VASOTEC) 10 MG tablet Take 10 mg by mouth daily.     furosemide (LASIX) 20 MG tablet Take 20 mg by mouth daily.     tamsulosin (FLOMAX) 0.4 MG CAPS capsule Take 1 capsule by mouth once daily 30 capsule 0    Pertinent medications related to GI and procedure were reviewed by me with the patient prior to the procedure   Current Facility-Administered Medications:    0.9 %  sodium chloride infusion, , Intravenous, Continuous, Annamaria Helling, DO, Last Rate: 20 mL/hr at 06/12/21 5364, Continued from Pre-op at 06/12/21 6803  sodium chloride 20 mL/hr at 06/12/21 2122       Allergies  Allergen Reactions   Iodinated Diagnostic Agents Rash   Allergies were reviewed by me prior to the procedure  Objective    Vitals:   06/12/21 0902  BP: (!) 197/86  Pulse: 74  Resp: 16  Temp: (!) 97.4 F (36.3 C)  TempSrc: Temporal  SpO2: 100%  Weight: 52.6 kg  Height: 6' (1.829 m)     Physical Exam Vitals and nursing note reviewed.  Constitutional:      General: He is not in acute distress.    Appearance: He is not toxic-appearing or diaphoretic.     Comments: thin  HENT:     Head: Normocephalic and atraumatic.     Nose: Nose normal.     Mouth/Throat:     Mouth: Mucous membranes are moist.     Pharynx: Oropharynx is clear.  Eyes:     General: No scleral icterus.    Extraocular Movements: Extraocular movements intact.  Cardiovascular:     Rate and Rhythm: Normal rate and regular rhythm.     Heart sounds: Normal heart sounds. No  murmur heard.   No friction rub. No gallop.  Pulmonary:     Effort: Pulmonary effort is normal. No respiratory distress.     Breath sounds: Normal breath sounds. No wheezing, rhonchi or rales.  Abdominal:     General: Abdomen is flat. Bowel sounds are normal. There is no distension.     Palpations: Abdomen is soft.     Tenderness: There is no abdominal tenderness. There is no guarding or rebound.  Musculoskeletal:     Cervical back: Neck supple.     Right lower leg: No edema.     Left lower leg: No edema.  Skin:    General: Skin is warm and dry.     Coloration: Skin is not jaundiced or pale.  Neurological:     General: No focal deficit present.     Mental Status: He is alert and oriented to person, place, and time. Mental status is at baseline.  Psychiatric:        Mood and Affect: Mood normal.        Behavior: Behavior normal.        Thought Content: Thought content normal.        Judgment: Judgment normal.     Assessment:  Mr. Shane Henderson is a 75 y.o. male  who presents today for Colonoscopy for bowel habit changes, constipation, weight loss.  Plan:  Colonoscopy with possible intervention today  Colonoscopy with possible biopsy, control of bleeding, polypectomy, and interventions as necessary has been discussed with the patient/patient representative. Informed consent was obtained from the patient/patient representative after explaining the indication, nature, and risks of the procedure including but not limited to death, bleeding, perforation, missed neoplasm/lesions, cardiorespiratory compromise, and reaction to medications. Opportunity for questions was given and appropriate answers were provided. Patient/patient representative has verbalized understanding is amenable to undergoing the procedure.   Annamaria Helling, DO  [facility]  Portions of the record may have been created with voice recognition software. Occasional wrong-word or 'sound-a-like'  substitutions may have occurred due to the inherent limitations of voice recognition software.  Read the chart carefully and recognize, using context, where substitutions may have occurred.

## 2021-06-12 NOTE — Anesthesia Procedure Notes (Signed)
Date/Time: 06/12/2021 9:57 AM Performed by: Vaughan Sine Pre-anesthesia Checklist: Patient identified, Emergency Drugs available, Suction available, Patient being monitored and Timeout performed Patient Re-evaluated:Patient Re-evaluated prior to induction Oxygen Delivery Method: Nasal cannula Preoxygenation: Pre-oxygenation with 100% oxygen Induction Type: IV induction Placement Confirmation: positive ETCO2 and CO2 detector

## 2021-06-12 NOTE — Op Note (Signed)
[facility] Patient Name: Shane Henderson Procedure Date: 06/12/2021 9:45 AM MRN: [id number] Account #: [id number] Date of Birth: [birth year] Admit Type: Outpatient Age: 75 Room: [facility] ENDO ROOM 1 Gender: Male Note Status: Finalized Procedure:             Colonoscopy Indications:           Abdominal pain in the left lower quadrant, Change in                         bowel habits, Constipation Providers:             Rueben Bash, DO Referring MD:          Cletis Athens, MD (Referring MD) Medicines:             Monitored Anesthesia Care Complications:         No immediate complications. Estimated blood loss:                         Minimal. Procedure:             Pre-Anesthesia Assessment:                        - Prior to the procedure, a History and Physical was                         performed, and patient medications and allergies were                         reviewed. The patient is competent. The risks and                         benefits of the procedure and the sedation options and                         risks were discussed with the patient. All questions                         were answered and informed consent was obtained.                         Patient identification and proposed procedure were                         verified by the physician, the nurse, the anesthetist                         and the technician in the endoscopy suite. Mental                         Status Examination: alert and oriented. Airway                         Examination: normal oropharyngeal airway and neck                         mobility. Respiratory Examination: clear to  auscultation. CV Examination: RRR, no murmurs, no S3                         or S4. Prophylactic Antibiotics: The patient does not                         require prophylactic antibiotics. Prior                         Anticoagulants: The patient has taken  no previous                         anticoagulant or antiplatelet agents. ASA Grade                         Assessment: II - A patient with mild systemic disease.                         After reviewing the risks and benefits, the patient                         was deemed in satisfactory condition to undergo the                         procedure. The anesthesia plan was to use monitored                         anesthesia care (MAC). Immediately prior to                         administration of medications, the patient was                         re-assessed for adequacy to receive sedatives. The                         heart rate, respiratory rate, oxygen saturations,                         blood pressure, adequacy of pulmonary ventilation, and                         response to care were monitored throughout the                         procedure. The physical status of the patient was                         re-assessed after the procedure.                        After obtaining informed consent, the colonoscope was                         passed under direct vision. Throughout the procedure,                         the patient's blood pressure, pulse, and oxygen  saturations were monitored continuously. The                         Colonoscope was introduced through the anus and                         advanced to the the cecum, identified by appendiceal                         orifice and ileocecal valve. The colonoscopy was                         performed without difficulty. The patient tolerated                         the procedure well. The quality of the bowel                         preparation was evaluated using the BBPS [facility] Bowel                         Preparation Scale) with scores of: Right Colon = 3,                         Transverse Colon = 3 and Left Colon = 3 (entire mucosa                         seen well with no residual staining, small  fragments                         of stool or opaque liquid). The total BBPS score                         equals 9. Findings:      The perianal and digital rectal examinations were normal. Pertinent       negatives include normal sphincter tone.      A 6 to 7 mm polyp was found in the rectum. The polyp was sessile. The       polyp was removed with a cold snare. Resection and retrieval were       complete. Estimated blood loss was minimal.      Multiple small-mouthed diverticula were found in the sigmoid colon.       Estimated blood loss: none.      Non-bleeding internal hemorrhoids were found during retroflexion. The       hemorrhoids were Grade I (internal hemorrhoids that do not prolapse).       Estimated blood loss: none.      The exam was otherwise without abnormality on direct and retroflexion       views. Impression:            - One 6 to 7 mm polyp in the rectum, removed with a                         cold snare. Resected and retrieved.                        - Diverticulosis in the sigmoid colon.                        -  Non-bleeding internal hemorrhoids.                        - The examination was otherwise normal on direct and                         retroflexion views. Recommendation:        - Discharge patient to home.                        - Resume previous diet.                        - Continue present medications.                        - Await pathology results.                        - Return to referring physician as previously                         scheduled.                        - Recommend continued bowel regimen- miralax daily.                         can consider other agents if no improvement Procedure Code(s):     --- Professional ---                        [phone], Colonoscopy, flexible; with removal of                         tumor(s), polyp(s), or other lesion(s) by snare                         technique Diagnosis Code(s):     --- Professional  ---                        K62.1, Rectal polyp                        K64.0, First degree hemorrhoids                        R10.32, Left lower quadrant pain                        R19.4, Change in bowel habit                        K59.00, Constipation, unspecified                        K57.30, Diverticulosis of large intestine without                         perforation or abscess without bleeding CPT copyright 2019 American Medical Association. All rights reserved. The codes documented in this report are preliminary and upon coder review may  be revised to meet current compliance requirements. Attending Participation:  I personally performed the entire procedure. Volney American, DO Annamaria Helling DO, DO 06/12/2021 10:32:15 AM This report has been signed electronically. Number of Addenda: 0 Note Initiated On: 06/12/2021 9:45 AM Scope Withdrawal Time: 0 hours 11 minutes 45 seconds  Total Procedure Duration: 0 hours 24 minutes 25 seconds  Estimated Blood Loss:  Estimated blood loss was minimal.      [facility]

## 2021-06-12 NOTE — Interval H&P Note (Signed)
History and Physical Interval Note: Preprocedure H&P from 06/12/21  was reviewed and there was no interval change after seeing and examining the patient.  Written consent was obtained from the patient after discussion of risks, benefits, and alternatives. Patient has consented to proceed with Colonoscopy with possible intervention   06/12/2021 9:57 AM  Shane Henderson  has presented today for surgery, with the diagnosis of BOWEL HABIT CHANGES, WIGHT LOSS.  The various methods of treatment have been discussed with the patient and family. After consideration of risks, benefits and other options for treatment, the patient has consented to  Procedure(s): COLONOSCOPY WITH PROPOFOL (N/A) as a surgical intervention.  The patient's history has been reviewed, patient examined, no change in status, stable for surgery.  I have reviewed the patient's chart and labs.  Questions were answered to the patient's satisfaction.     Annamaria Helling

## 2021-06-12 NOTE — Anesthesia Postprocedure Evaluation (Signed)
Anesthesia Post Note  Patient: Shane Henderson  Procedure(s) Performed: COLONOSCOPY WITH PROPOFOL  Patient location during evaluation: Phase II Anesthesia Type: General Level of consciousness: awake and alert, awake and oriented Pain management: pain level controlled Vital Signs Assessment: post-procedure vital signs reviewed and stable Respiratory status: spontaneous breathing, nonlabored ventilation and respiratory function stable Cardiovascular status: blood pressure returned to baseline and stable Postop Assessment: no apparent nausea or vomiting Anesthetic complications: no   No notable events documented.   Last Vitals:  Vitals:   06/12/21 1040 06/12/21 1050  BP: 112/73 (!) 146/74  Pulse: 64 66  Resp: 19 14  Temp:    SpO2: 98% 98%    Last Pain:  Vitals:   06/12/21 1030  TempSrc: Temporal  PainSc:                  Phill Mutter

## 2021-06-13 LAB — SURGICAL PATHOLOGY

## 2021-07-03 ENCOUNTER — Other Ambulatory Visit: Payer: Self-pay | Admitting: Internal Medicine

## 2021-07-03 DIAGNOSIS — Z87898 Personal history of other specified conditions: Secondary | ICD-10-CM | POA: Diagnosis not present

## 2021-07-03 DIAGNOSIS — K5900 Constipation, unspecified: Secondary | ICD-10-CM | POA: Diagnosis not present

## 2021-07-22 ENCOUNTER — Other Ambulatory Visit: Payer: Self-pay | Admitting: Gastroenterology

## 2021-07-22 DIAGNOSIS — R194 Change in bowel habit: Secondary | ICD-10-CM

## 2021-07-22 DIAGNOSIS — R109 Unspecified abdominal pain: Secondary | ICD-10-CM

## 2021-07-30 ENCOUNTER — Ambulatory Visit
Admission: RE | Admit: 2021-07-30 | Discharge: 2021-07-30 | Disposition: A | Discharge status: Home / Self Care | Payer: Medicare HMO | Source: Ambulatory Visit | Attending: Gastroenterology | Admitting: Gastroenterology

## 2021-07-30 ENCOUNTER — Other Ambulatory Visit: Payer: Self-pay

## 2021-07-30 DIAGNOSIS — R194 Change in bowel habit: Secondary | ICD-10-CM | POA: Insufficient documentation

## 2021-07-30 DIAGNOSIS — K8020 Calculus of gallbladder without cholecystitis without obstruction: Secondary | ICD-10-CM | POA: Diagnosis not present

## 2021-07-30 DIAGNOSIS — R634 Abnormal weight loss: Secondary | ICD-10-CM | POA: Diagnosis not present

## 2021-07-30 DIAGNOSIS — G479 Sleep disorder, unspecified: Secondary | ICD-10-CM | POA: Diagnosis not present

## 2021-07-30 DIAGNOSIS — R109 Unspecified abdominal pain: Secondary | ICD-10-CM | POA: Insufficient documentation

## 2021-07-30 DIAGNOSIS — N3289 Other specified disorders of bladder: Secondary | ICD-10-CM | POA: Diagnosis not present

## 2021-08-04 ENCOUNTER — Other Ambulatory Visit: Payer: Self-pay | Admitting: Internal Medicine

## 2021-08-04 DIAGNOSIS — R935 Abnormal findings on diagnostic imaging of other abdominal regions, including retroperitoneum: Secondary | ICD-10-CM | POA: Diagnosis not present

## 2021-08-04 DIAGNOSIS — N400 Enlarged prostate without lower urinary tract symptoms: Secondary | ICD-10-CM | POA: Diagnosis not present

## 2021-08-06 ENCOUNTER — Ambulatory Visit: Payer: Medicare HMO | Admitting: Urology

## 2021-08-06 ENCOUNTER — Encounter: Payer: Self-pay | Admitting: Urology

## 2021-08-06 ENCOUNTER — Other Ambulatory Visit: Payer: Self-pay

## 2021-08-06 VITALS — BP 166/47 | HR 66 | Ht 72.0 in | Wt 117.0 lb

## 2021-08-06 DIAGNOSIS — N138 Other obstructive and reflux uropathy: Secondary | ICD-10-CM

## 2021-08-06 DIAGNOSIS — K5900 Constipation, unspecified: Secondary | ICD-10-CM | POA: Diagnosis not present

## 2021-08-06 DIAGNOSIS — N401 Enlarged prostate with lower urinary tract symptoms: Secondary | ICD-10-CM | POA: Diagnosis not present

## 2021-08-06 DIAGNOSIS — N320 Bladder-neck obstruction: Secondary | ICD-10-CM | POA: Diagnosis not present

## 2021-08-06 NOTE — Patient Instructions (Signed)
Constipation, Adult Constipation is when a person has trouble pooping (having a bowel movement). When you have this condition, you may poop fewer than 3 times a week. Your poop (stool) may also be dry, hard, or bigger than normal. Follow these instructions at home: Eating and drinking  Eat foods that have a lot of fiber, such as: Fresh fruits and vegetables. Whole grains. Beans. Eat less of foods that are low in fiber and high in fat and sugar, such as: [place] fries. Hamburgers. Cookies. Candy. Soda. Drink enough fluid to keep your pee (urine) pale yellow. General instructions Exercise regularly or as told by your doctor. Try to do 150 minutes of exercise each week. Go to the restroom when you feel like you need to poop. Do not hold it in. Take over-the-counter and prescription medicines only as told by your doctor. These include any fiber supplements. When you poop: Do deep breathing while relaxing your lower belly (abdomen). Relax your pelvic floor. The pelvic floor is a group of muscles that support the rectum, bladder, and intestines (as well as the uterus in women). Watch your condition for any changes. Tell your doctor if you notice any. Keep all follow-up visits as told by your doctor. This is important. Contact a doctor if: You have pain that gets worse. You have a fever. You have not pooped for 4 days. You vomit. You are not hungry. You lose weight. You are bleeding from the opening of the butt (anus). You have thin, pencil-like poop. Get help right away if: You have a fever, and your symptoms suddenly get worse. You leak poop or have blood in your poop. Your belly feels hard or bigger than normal (bloated). You have very bad belly pain. You feel dizzy or you faint. Summary Constipation is when a person poops fewer than 3 times a week, has trouble pooping, or has poop that is dry, hard, or bigger than normal. Eat foods that have a lot of fiber. Drink enough fluid  to keep your pee (urine) pale yellow. Take over-the-counter and prescription medicines only as told by your doctor. These include any fiber supplements. This information is not intended to replace advice given to you by your health care provider. Make sure you discuss any questions you have with your health care provider. Document Revised: 08/30/2019 Document Reviewed: 08/30/2019 Elsevier Patient Education  2022 [place].   Chronic Constipation Chronic constipation is a condition in which a person has three or fewer bowel movements a week, for 3 months or longer. This condition is especially common in older adults. What are the causes? Causes of chronic constipation may include: Not drinking enough fluid, eating enough food or fiber, or getting enough physical activity. Pregnancy. A tear in the anus (anal fissure). Blockage in the bowel (bowel obstruction). Narrowing of the bowel (bowel stricture). Having a long-term medical condition, such as: Diabetes, hypothyroidism, or iron-deficiency anemia. Stroke or spinal cord injury. Multiple sclerosis or Parkinson's disease. Colon cancer. Dementia. Inflammatory bowel disease (IBD), outward collapse of the rectum (rectal prolapse), or hemorrhoids. Taking certain medicines, including: Narcotics. These are a certain type of prescription pain medicine. Antacids or iron supplements. Water pills (diuretics). Certain blood pressure medicines. Anti-seizure medicines. Antidepressants. Medicines for Parkinson's disease. Other causes of this condition may include: Stress. Problems in the nerves and muscles that control the movement of stool. Weak or impaired pelvic floor muscles. What increases the risk? You may be at higher risk for chronic constipation if: You are older than  age 52. You are male. You live in a long-term care facility. You have a long-term disease. You have a mental health disorder or eating disorder. What are the  signs or symptoms? The main symptom of chronic constipation is having three or fewer bowel movements a week for several weeks. Other signs and symptoms may vary from person to person. These include: Pushing hard (straining) to pass stool, or having hard or lumpy stools. Painful bowel movements. Having lower abdominal discomfort, such as cramps or bloating. Being unable to have a bowel movement when you feel the urge, or feeling like you still need to pass stool after a bowel movement. Feeling that you have something in your rectum that is blocking or preventing bowel movements. Seeing blood on the toilet paper or in your stool. Worsening confusion (in older adults). How is this diagnosed? This condition may be diagnosed based on: Your symptoms and medical history. You will be asked about your symptoms, lifestyle, diet, and any medicines that you are taking. A physical exam. Your abdomen will be examined. A digital rectal exam may be done. For this exam, a health care provider places a lubricated, gloved finger into the rectum. Tests to check for any underlying causes of your constipation. These may be ordered if you have bleeding in your rectum, weight loss, or a family history of colon cancer. In these cases, you may have: Imaging studies of the colon. These may include X-ray, ultrasound, or a CT scan. Blood tests. A procedure to examine the inside of your colon (colonoscopy). More specialized tests to check: Whether your anal sphincter works well. This is a ring-shaped muscle that controls the closing of the anus. How well food moves through your colon. Tests to measure the nerve signal in your pelvic floor muscles (electromyography). How is this treated? Treatment for chronic constipation depends on the cause. Most often, treatment starts with: Being more active and getting regular exercise. Drinking more fluids. Adding fiber to your diet. Sources of fiber include fruits, vegetables,  whole grains, and fiber supplements. Using medicines such as stool softeners or medicines that increase contractions in your digestive system (pro-motility agents). Training your pelvic muscles with biofeedback. Surgery, if there is obstruction. Treatment may also include: Stopping or changing some medicines if they cause constipation. Using a fiber supplement (bulk laxative) or stool softener. Using a prescription laxative. This works by [employer] into your colon (osmotic laxative). You may also need to see a specialist who treats conditions of the digestive system (gastroenterologist). Follow these instructions at home: Medicines Take over-the-counter and prescription medicines only as told by your health care provider. If you are taking a laxative, take it as told by your health care provider. Eating and drinking  Eat a balanced diet that includes enough fiber. Ask your health care provider to recommend a diet that is right for you. Drink clear fluids, especially water. Avoid drinking alcohol, caffeine, and soda. These can make constipation worse. Drink enough fluid to keep your urine pale yellow. General instructions Get some physical activity every day. Ask your health care provider what activities are safe for you. Get colon cancer screenings as told by your health care provider. Keep all follow-up visits as told by your health care provider. This is important. Contact a health care provider if you have: Three or fewer bowel movements a week. Stools that are hard or lumpy. Blood on the toilet paper or in your stool after you have a bowel movement. Unexplained weight loss.  Rectum (rectal) pain. Stool leakage. Nausea or vomiting. Get help right away if you have: Rectal bleeding or you pass blood clots. Severe rectal pain. Body tissue that pushes out (protrudes) from your anus. Severe pain or bloating (distension) in your abdomen. Vomiting that you cannot  control. Summary Chronic constipation is a condition in which a person has three or fewer bowel movements a week, for 3 months or longer. You may have a higher risk for this condition if you are an older adult, you are male, or you have a long-term disease. Treatment for this condition depends on the cause. Most treatments for chronic constipation include adding fiber to your diet, drinking more fluids, and getting more physical activity. You may also need to treat any underlying medical conditions or stop or change certain medicines if they cause constipation. If lifestyle changes do not relieve constipation, your health care provider may recommend taking a laxative. This information is not intended to replace advice given to you by your health care provider. Make sure you discuss any questions you have with your health care provider. Document Revised: 08/30/2019 Document Reviewed: 08/30/2019 Elsevier Patient Education  [place].

## 2021-08-06 NOTE — Progress Notes (Signed)
   08/06/21 11:06 AM   Abe People Koleen Nimrod 09-20-1946 [id number]  CC: Bladder wall thickening, constipation  HPI: I saw Mr. Boys today for incidental finding of bladder wall thickening on CT performed for abdominal pain and constipation.  He is a 75 year old male has been on Flomax long-term and denies any significant urinary symptoms.  He denies any gross hematuria, UTIs, or history of retention.  IPSS score today is 8, with quality of life mostly satisfied, and PVR is normal today.  His primary issue is constipation, and he continues to be bothered by irregular bowel movements.  He had a normal colonoscopy recently, and follows with GI.  Recent PSA was normal at 0.52.  PMH: Past Medical History:  Diagnosis Date   Anxiety    Constipation    Dysrhythmia    History of kidney stones    Lumbar radiculopathy    Lumbar stenosis    Neuromuscular disorder [facility])     Surgical History: Past Surgical History:  Procedure Laterality Date   COLONOSCOPY WITH PROPOFOL N/A 06/12/2021   Procedure: COLONOSCOPY WITH PROPOFOL;  Surgeon: Annamaria Helling, DO;  Location: [facility] ENDOSCOPY;  Service: Gastroenterology;  Laterality: N/A;   KIDNEY STONE SURGERY     LUMBAR LAMINECTOMY/ DECOMPRESSION WITH MET-RX N/A 06/03/2020   Procedure: L4/5 LAMINECTOMY WITH MET-RX;  Surgeon: Deetta Perla, MD;  Location: [facility] ORS;  Service: Neurosurgery;  Laterality: N/A;   MITRAL VALVE REPAIR       Family History: No family history on file.  Social History:  reports that he has never smoked. His smokeless tobacco use includes chew. He reports current alcohol use of about 8.0 standard drinks per week. He reports that he does not use drugs.  Physical Exam: BP (!) 166/47 (BP Location: Left Arm, Patient Position: Sitting, Cuff Size: Normal)   Pulse 66   Ht 6' (1.829 m)   Wt 117 lb (53.1 kg)   BMI 15.87 kg/m    Constitutional:  Alert and oriented, No acute distress. Cardiovascular: No clubbing, cyanosis, or  edema. Respiratory: Normal respiratory effort, no increased work of breathing. GI: Abdomen is soft, nontender, nondistended, no abdominal masses   Laboratory Data: Reviewed, see HPI  Pertinent Imaging: I have personally viewed and interpreted the CT dated 07/31/2021 showing subtle bladder wall thickening without any obvious tumors or lesions, normal size prostate measuring 32 g, no hydronephrosis or stones.  Assessment & Plan:   75 year old male with constipation and incidental finding of subtle bladder wall thickening on CT.  No significant urinary symptoms on Flomax, prostate normal size 32 g on CT, urinalysis completely benign.  We discussed that his constipation and incidental finding of bladder wall thickening on CT are completely unrelated, and I recommended following up with GI regarding his persistent problems with constipation and irregular bowel movements.  No further evaluation needed from a urology perspective regarding bladder wall thickening on CT   Nickolas Madrid, MD 08/06/2021  [facility] [address], [place],  [address] [phone]

## 2021-08-18 DIAGNOSIS — K581 Irritable bowel syndrome with constipation: Secondary | ICD-10-CM | POA: Diagnosis not present

## 2021-08-18 DIAGNOSIS — R1084 Generalized abdominal pain: Secondary | ICD-10-CM | POA: Diagnosis not present

## 2021-08-18 DIAGNOSIS — F419 Anxiety disorder, unspecified: Secondary | ICD-10-CM | POA: Diagnosis not present

## 2021-08-19 ENCOUNTER — Other Ambulatory Visit: Payer: Self-pay | Admitting: Gastroenterology

## 2021-08-19 DIAGNOSIS — R1084 Generalized abdominal pain: Secondary | ICD-10-CM

## 2021-08-22 DIAGNOSIS — R1084 Generalized abdominal pain: Secondary | ICD-10-CM | POA: Diagnosis not present

## 2021-08-28 ENCOUNTER — Ambulatory Visit
Admission: RE | Admit: 2021-08-28 | Discharge: 2021-08-28 | Disposition: A | Discharge status: Home / Self Care | Payer: Medicare HMO | Source: Ambulatory Visit | Attending: Gastroenterology | Admitting: Gastroenterology

## 2021-08-28 ENCOUNTER — Other Ambulatory Visit: Payer: Self-pay

## 2021-08-28 DIAGNOSIS — K8020 Calculus of gallbladder without cholecystitis without obstruction: Secondary | ICD-10-CM | POA: Diagnosis not present

## 2021-08-28 DIAGNOSIS — R1084 Generalized abdominal pain: Secondary | ICD-10-CM | POA: Diagnosis not present

## 2021-09-01 ENCOUNTER — Other Ambulatory Visit: Payer: Self-pay | Admitting: Gastroenterology

## 2021-09-01 ENCOUNTER — Other Ambulatory Visit: Payer: Self-pay | Admitting: Nurse Practitioner

## 2021-09-01 DIAGNOSIS — R109 Unspecified abdominal pain: Secondary | ICD-10-CM

## 2021-09-03 ENCOUNTER — Other Ambulatory Visit: Payer: Self-pay | Admitting: Internal Medicine

## 2021-09-08 ENCOUNTER — Ambulatory Visit
Admission: RE | Admit: 2021-09-08 | Discharge: 2021-09-08 | Disposition: A | Discharge status: Home / Self Care | Payer: Medicare HMO | Source: Ambulatory Visit | Attending: Gastroenterology | Admitting: Gastroenterology

## 2021-09-08 ENCOUNTER — Other Ambulatory Visit: Payer: Self-pay

## 2021-09-08 DIAGNOSIS — R100 Acute abdomen: Secondary | ICD-10-CM | POA: Diagnosis not present

## 2021-09-08 DIAGNOSIS — R109 Unspecified abdominal pain: Secondary | ICD-10-CM | POA: Diagnosis not present

## 2021-09-08 MED ORDER — TECHNETIUM TC 99M MEBROFENIN IV KIT
5.0000 | PACK | Freq: Once | INTRAVENOUS | Status: AC | PRN
  Administered 2021-09-08: 5.31 via INTRAVENOUS

## 2021-09-15 ENCOUNTER — Ambulatory Visit: Payer: Medicare HMO | Admitting: Internal Medicine

## 2021-09-15 DIAGNOSIS — K5900 Constipation, unspecified: Secondary | ICD-10-CM | POA: Diagnosis not present

## 2021-09-15 DIAGNOSIS — R948 Abnormal results of function studies of other organs and systems: Secondary | ICD-10-CM | POA: Diagnosis not present

## 2021-09-15 DIAGNOSIS — F1090 Alcohol use, unspecified, uncomplicated: Secondary | ICD-10-CM | POA: Diagnosis not present

## 2021-10-02 IMAGING — MR MR LUMBAR SPINE W/O CM
5 series · 30 of 48 positions shown · non-contrast
Comparison: None.

CLINICAL DATA: Chronic left hip and left leg weakness for the past
year. Recent fall 2 weeks ago.

EXAM:
MRI LUMBAR SPINE WITHOUT CONTRAST
TECHNIQUE: Multiplanar, multisequence MR imaging of the lumbar spine was
performed. No intravenous contrast was administered.

[Series 5: T2 · sagittal · 4.0mm · 0.81mm/px · 6 of 17 slices shown (1 of 2)]
[im 1/17]
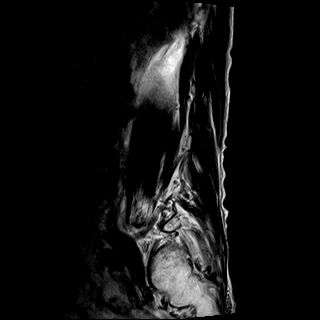
[im 4/17]
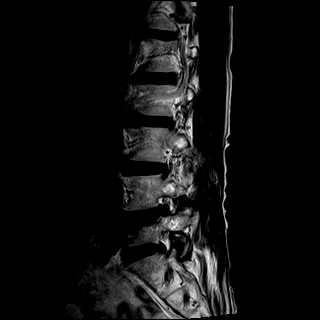
[im 7/17]
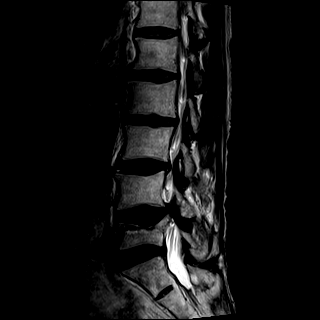
[im 10/17]
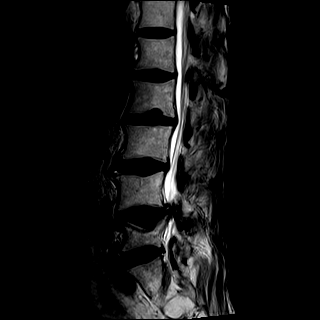
[im 13/17]
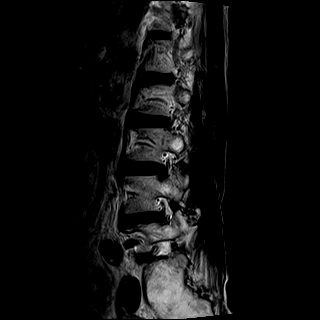
[im 17/17]
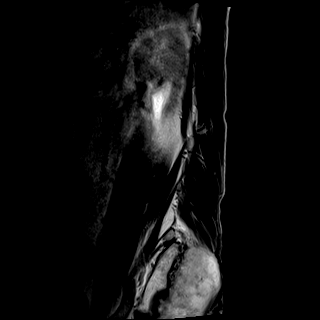

[Series 6: T1 · sagittal · 4.0mm · 0.81mm/px · 7 of 17 slices shown (1 of 2)]
[im 1/17]
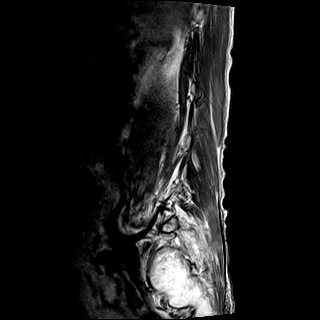
[im 3/17]
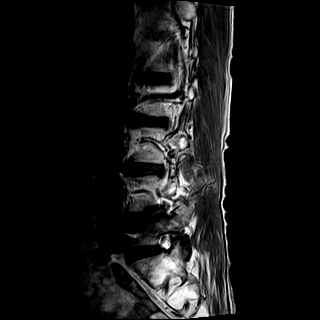
[im 6/17]
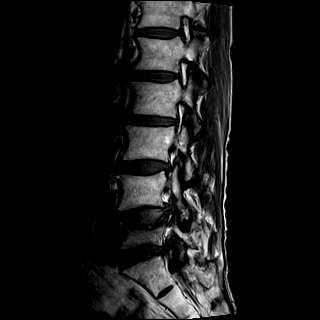
[im 9/17]
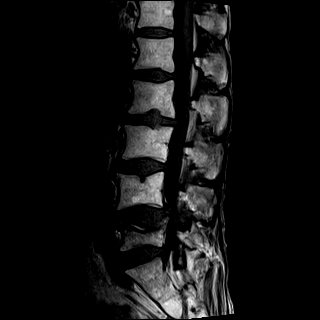
[im 11/17]
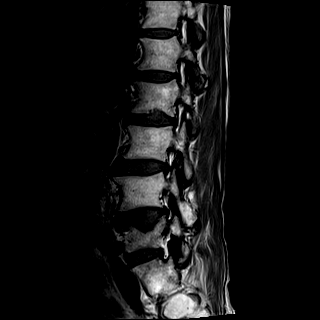
[im 14/17]
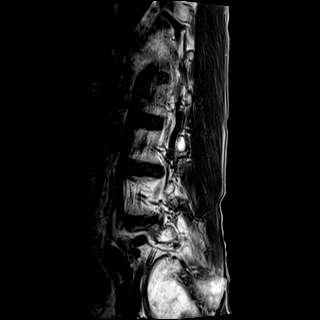
[im 17/17]
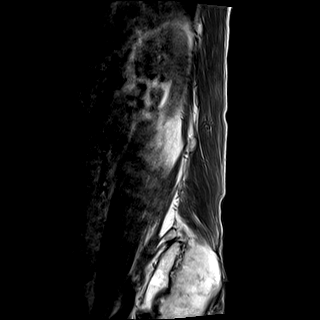

[Series 7: T2 · axial · 4.0mm · 0.78mm/px · z∈[-180,+25]mm · 8 of 35 slices shown (2 of 2)]
[im 1/35]
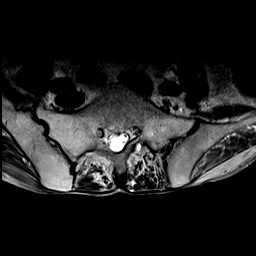
[im 6/35]
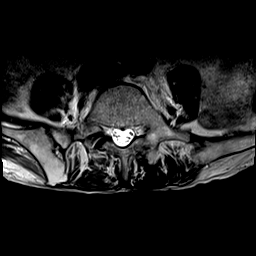
[im 11/35]
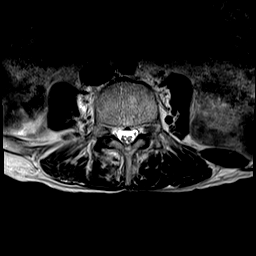
[im 16/35]
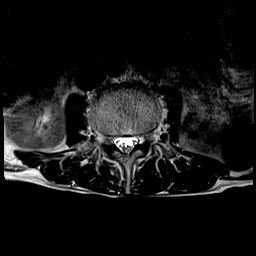
[im 19/35]
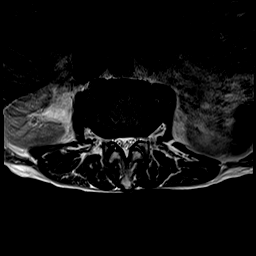
[im 24/35]
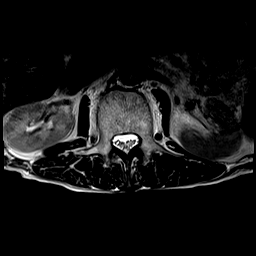
[im 29/35]
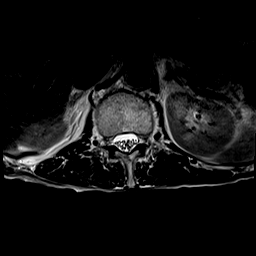
[im 35/35]
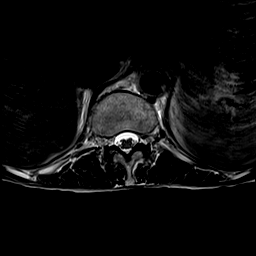

[Series 8: STIR · sagittal · 4.0mm · 0.41mm/px · 1 of 17 slices shown]
[im 1/17]
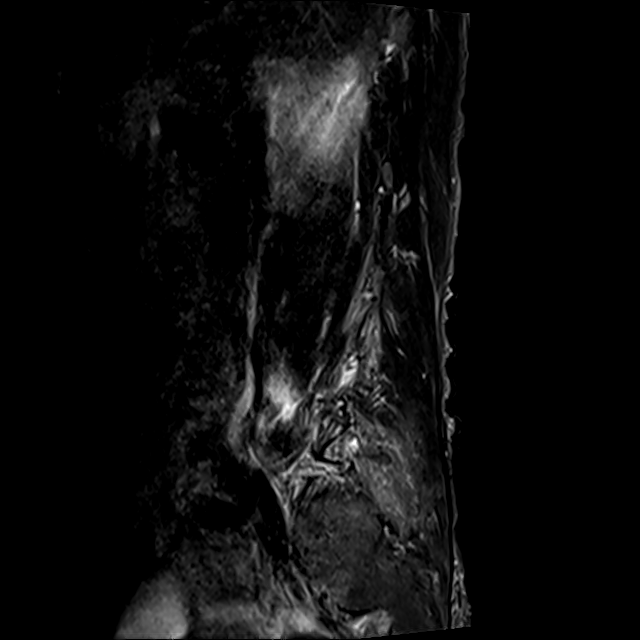

[Series 9: T1 · axial · 4.0mm · 0.39mm/px · z∈[-180,+25]mm · 8 of 35 slices shown (2 of 2)]
[im 1/35]
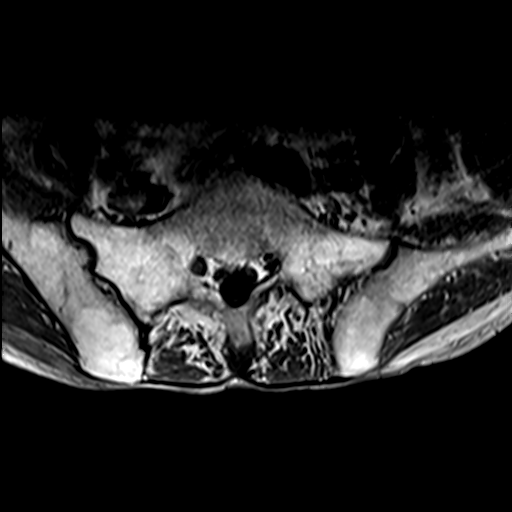
[im 6/35]
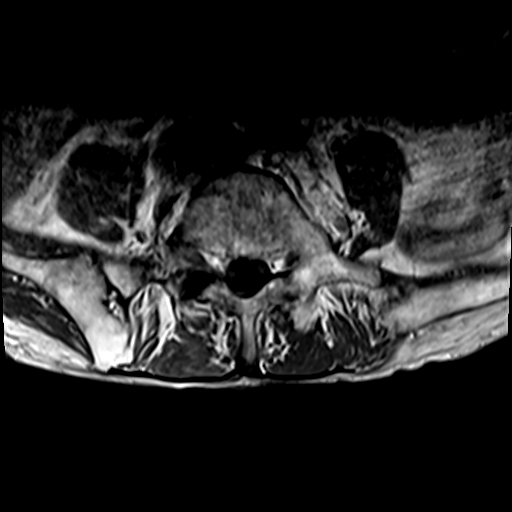
[im 11/35]
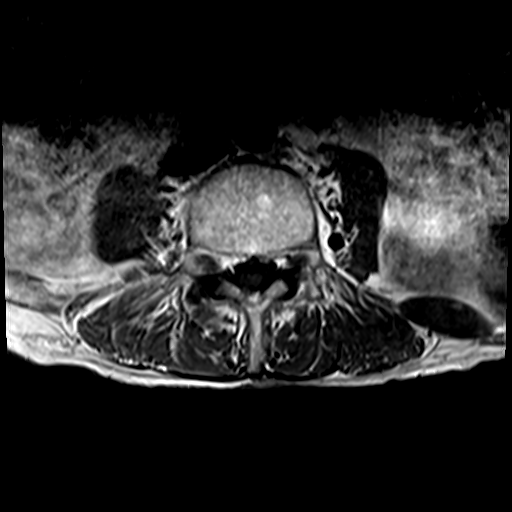
[im 16/35]
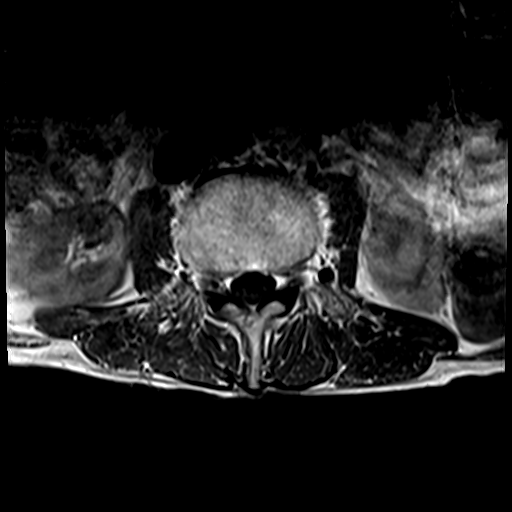
[im 19/35]
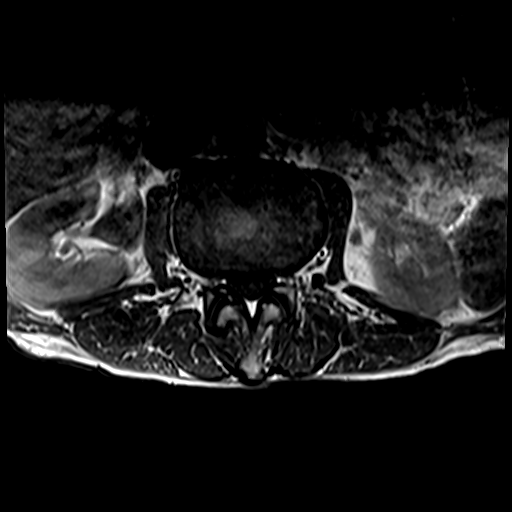
[im 24/35]
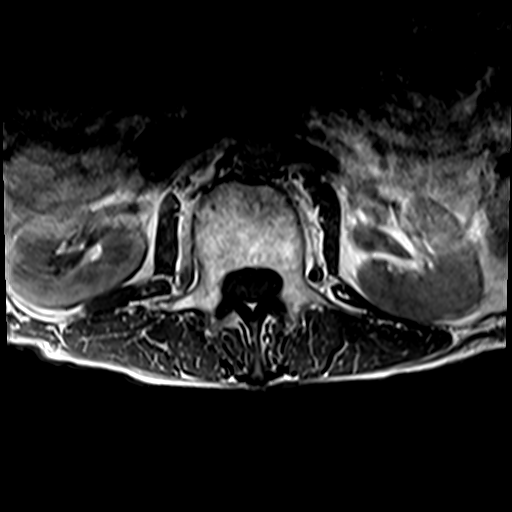
[im 29/35]
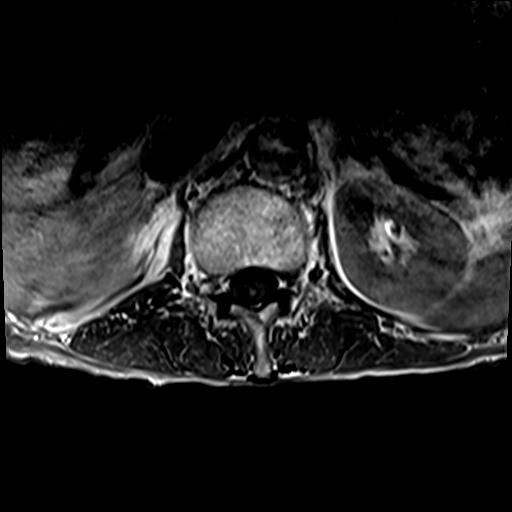
[im 35/35]
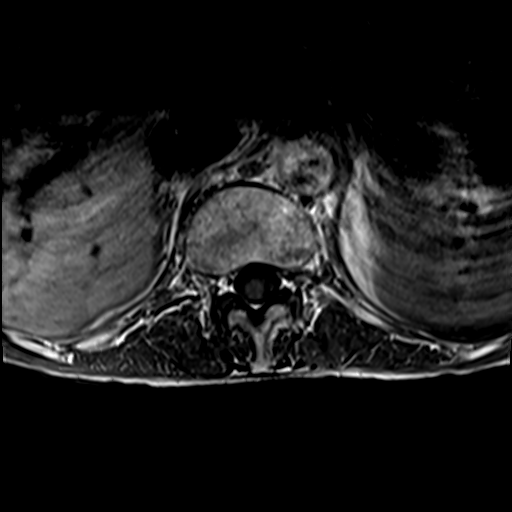

[30 of 48 positions shown; findings below may reference images not displayed]

FINDINGS: Segmentation: Assumed standard. The last well-formed disc space is
designated L5-S1 for the purposes of this report.

Alignment: Straightening of the normal cervical lordosis. Trace
retrolisthesis at L3-L4.

Vertebrae: Subacute benign-appearing L5 superior endplate
compression fracture with up to 35% height loss centrally. 7 mm
retropulsion of the posterosuperior endplate. No additional
fracture. No evidence of discitis or suspicious bone lesion.

Conus medullaris and cauda equina: Conus extends to the L1 level.
Conus and cauda equina appear normal.

Paraspinal and other soft tissues: Negative.

Disc levels:

T12-L1: Negative disc. Mild bilateral facet arthropathy. No
stenosis.

L1-L2:  Minimal disc bulging.  No stenosis.

L2-L3: Mild disc bulging. Mild spinal canal and bilateral lateral
recess stenosis. No neuroforaminal stenosis.

L3-L4: Mild disc bulging and bilateral facet arthropathy. Borderline
mild spinal canal stenosis. Mild right greater than left lateral
recess stenosis. Mild right neuroforaminal stenosis. No left
neuroforaminal stenosis.

L4-L5: Retropulsion, mild disc bulging with central annular fissure,
and moderate bilateral facet arthropathy contribute to severe spinal
canal and lateral recess stenosis. Mild right neuroforaminal
stenosis. No left neuroforaminal stenosis.

L5-S1: Small biforaminal disc protrusions with annular fissures.
Mild bilateral facet arthropathy. Mild-to-moderate right and mild
left lateral recess stenosis. No spinal canal or neuroforaminal
stenosis.
IMPRESSION: 1. Subacute benign-appearing L5 superior endplate compression
fracture with up to 35% height loss centrally and 7 mm retropulsion.
2. Multilevel degenerative changes of the lumbar spine as described
above, worst at L4-L5 where there is severe spinal canal and lateral
recess stenosis.

## 2021-10-03 ENCOUNTER — Other Ambulatory Visit: Payer: Self-pay | Admitting: Internal Medicine

## 2021-11-18 DIAGNOSIS — I10 Essential (primary) hypertension: Secondary | ICD-10-CM | POA: Diagnosis not present

## 2021-11-18 DIAGNOSIS — I4891 Unspecified atrial fibrillation: Secondary | ICD-10-CM | POA: Diagnosis not present

## 2021-11-18 DIAGNOSIS — I340 Nonrheumatic mitral (valve) insufficiency: Secondary | ICD-10-CM | POA: Diagnosis not present

## 2021-11-18 DIAGNOSIS — I351 Nonrheumatic aortic (valve) insufficiency: Secondary | ICD-10-CM | POA: Diagnosis not present

## 2021-11-18 DIAGNOSIS — I2510 Atherosclerotic heart disease of native coronary artery without angina pectoris: Secondary | ICD-10-CM | POA: Diagnosis not present

## 2021-12-08 ENCOUNTER — Other Ambulatory Visit: Payer: Self-pay | Admitting: Internal Medicine

## 2021-12-09 ENCOUNTER — Other Ambulatory Visit: Payer: Self-pay | Admitting: *Deleted

## 2021-12-09 MED ORDER — TAMSULOSIN HCL 0.4 MG PO CAPS: 0.4000 mg | ORAL_CAPSULE | Freq: Every day | ORAL | 1 refills | Status: DC

## 2022-01-13 DIAGNOSIS — I10 Essential (primary) hypertension: Secondary | ICD-10-CM | POA: Diagnosis not present

## 2022-01-13 DIAGNOSIS — Z87898 Personal history of other specified conditions: Secondary | ICD-10-CM | POA: Diagnosis not present

## 2022-01-13 DIAGNOSIS — F419 Anxiety disorder, unspecified: Secondary | ICD-10-CM | POA: Diagnosis not present

## 2022-01-13 DIAGNOSIS — F322 Major depressive disorder, single episode, severe without psychotic features: Secondary | ICD-10-CM | POA: Diagnosis not present

## 2022-01-13 DIAGNOSIS — R1032 Left lower quadrant pain: Secondary | ICD-10-CM | POA: Diagnosis not present

## 2022-01-13 DIAGNOSIS — K5900 Constipation, unspecified: Secondary | ICD-10-CM | POA: Diagnosis not present

## 2022-01-13 DIAGNOSIS — K581 Irritable bowel syndrome with constipation: Secondary | ICD-10-CM | POA: Diagnosis not present

## 2022-01-13 DIAGNOSIS — F411 Generalized anxiety disorder: Secondary | ICD-10-CM | POA: Diagnosis not present

## 2022-01-13 DIAGNOSIS — F1090 Alcohol use, unspecified, uncomplicated: Secondary | ICD-10-CM | POA: Diagnosis not present

## 2022-06-30 DIAGNOSIS — I340 Nonrheumatic mitral (valve) insufficiency: Secondary | ICD-10-CM | POA: Diagnosis not present

## 2022-06-30 DIAGNOSIS — I4891 Unspecified atrial fibrillation: Secondary | ICD-10-CM | POA: Diagnosis not present

## 2022-06-30 DIAGNOSIS — I351 Nonrheumatic aortic (valve) insufficiency: Secondary | ICD-10-CM | POA: Diagnosis not present

## 2022-06-30 DIAGNOSIS — R0602 Shortness of breath: Secondary | ICD-10-CM | POA: Diagnosis not present

## 2022-06-30 DIAGNOSIS — I10 Essential (primary) hypertension: Secondary | ICD-10-CM | POA: Diagnosis not present

## 2022-06-30 DIAGNOSIS — I2510 Atherosclerotic heart disease of native coronary artery without angina pectoris: Secondary | ICD-10-CM | POA: Diagnosis not present

## 2022-07-09 ENCOUNTER — Other Ambulatory Visit: Payer: Self-pay | Admitting: Internal Medicine

## 2022-08-07 DIAGNOSIS — I4891 Unspecified atrial fibrillation: Secondary | ICD-10-CM | POA: Diagnosis not present

## 2022-08-11 DIAGNOSIS — I2510 Atherosclerotic heart disease of native coronary artery without angina pectoris: Secondary | ICD-10-CM | POA: Diagnosis not present

## 2022-08-11 DIAGNOSIS — I351 Nonrheumatic aortic (valve) insufficiency: Secondary | ICD-10-CM | POA: Diagnosis not present

## 2022-08-11 DIAGNOSIS — I340 Nonrheumatic mitral (valve) insufficiency: Secondary | ICD-10-CM | POA: Diagnosis not present

## 2022-08-11 DIAGNOSIS — I10 Essential (primary) hypertension: Secondary | ICD-10-CM | POA: Diagnosis not present

## 2022-08-11 DIAGNOSIS — R0602 Shortness of breath: Secondary | ICD-10-CM | POA: Diagnosis not present

## 2022-08-11 DIAGNOSIS — I4891 Unspecified atrial fibrillation: Secondary | ICD-10-CM | POA: Diagnosis not present

## 2022-10-12 ENCOUNTER — Other Ambulatory Visit: Payer: Self-pay | Admitting: Internal Medicine

## 2022-10-13 NOTE — Telephone Encounter (Signed)
Advised wife that Selim needs to come in for an appointment, wife said she would notify her husband and reach out to schedule at a later date.

## 2022-10-14 ENCOUNTER — Ambulatory Visit (INDEPENDENT_AMBULATORY_CARE_PROVIDER_SITE_OTHER): Payer: Medicare HMO | Admitting: Internal Medicine

## 2022-10-14 ENCOUNTER — Encounter: Payer: Self-pay | Admitting: Internal Medicine

## 2022-10-14 VITALS — BP 158/88 | HR 75 | Ht 72.0 in | Wt 112.5 lb

## 2022-10-14 DIAGNOSIS — F419 Anxiety disorder, unspecified: Secondary | ICD-10-CM

## 2022-10-14 DIAGNOSIS — Z952 Presence of prosthetic heart valve: Secondary | ICD-10-CM

## 2022-10-14 DIAGNOSIS — F17210 Nicotine dependence, cigarettes, uncomplicated: Secondary | ICD-10-CM

## 2022-10-14 DIAGNOSIS — K5904 Chronic idiopathic constipation: Secondary | ICD-10-CM | POA: Diagnosis not present

## 2022-10-14 DIAGNOSIS — J069 Acute upper respiratory infection, unspecified: Secondary | ICD-10-CM | POA: Diagnosis not present

## 2022-10-14 DIAGNOSIS — R059 Cough, unspecified: Secondary | ICD-10-CM

## 2022-10-14 DIAGNOSIS — F1010 Alcohol abuse, uncomplicated: Secondary | ICD-10-CM

## 2022-10-14 DIAGNOSIS — M5416 Radiculopathy, lumbar region: Secondary | ICD-10-CM | POA: Diagnosis not present

## 2022-10-14 DIAGNOSIS — M48061 Spinal stenosis, lumbar region without neurogenic claudication: Secondary | ICD-10-CM | POA: Diagnosis not present

## 2022-10-14 DIAGNOSIS — Z720 Tobacco use: Secondary | ICD-10-CM

## 2022-10-14 LAB — POCT INFLUENZA A/B
Influenza A, POC: NEGATIVE
Influenza B, POC: NEGATIVE

## 2022-10-14 MED ORDER — TAMSULOSIN HCL 0.4 MG PO CAPS: 0.4000 mg | ORAL_CAPSULE | Freq: Every day | ORAL | 0 refills | Status: DC

## 2022-10-14 MED ORDER — AMOXICILLIN 500 MG PO CAPS: 500.0000 mg | ORAL_CAPSULE | Freq: Three times a day (TID) | ORAL | 0 refills | Status: AC

## 2022-10-14 NOTE — Assessment & Plan Note (Signed)
-   Patient experiencing high levels of anxiety.  - Encouraged patient to engage in relaxing activities like yoga, meditation, journaling, going for a walk, or participating in a hobby.  - Encouraged patient to reach out to trusted friends or family members about recent struggles, Patient was advised to read A book, how to stop worrying and start living, it is good book to read to control  the stress  

## 2022-10-14 NOTE — Assessment & Plan Note (Signed)
Patient was advised to stop drinking

## 2022-10-14 NOTE — Assessment & Plan Note (Signed)
Continue with Linzess. 

## 2022-10-14 NOTE — Assessment & Plan Note (Signed)
Heart is regular chest no rhonchi.  Abdomen is soft nontender with no pedal edema.

## 2022-10-14 NOTE — Assessment & Plan Note (Signed)
-   I instructed the patient to stop smoking and provided them with smoking cessation materials.  - I informed the patient that smoking puts them at increased risk for cancer, COPD, hypertension, and more.  - Informed the patient to seek help if they begin to have trouble breathing, develop chest pain, start to cough up blood, feel faint, or pass out.  

## 2022-10-14 NOTE — Progress Notes (Signed)
Established Patient Office Visit  Subjective:  Patient ID: Shane Henderson, male    DOB: 1946-02-06  Age: 76 y.o. MRN: [id number]  CC:  Chief Complaint  Patient presents with   Medication Refill    Stopped all medications except tamsulosin, states he doesn't need it.     Medication Refill    TRAYSON STITELY presents for cold 7 days  Past Medical History:  Diagnosis Date   Anxiety    Constipation    Dysrhythmia    History of kidney stones    Lumbar radiculopathy    Lumbar stenosis    Neuromuscular disorder [facility])     Past Surgical History:  Procedure Laterality Date   COLONOSCOPY WITH PROPOFOL N/A 06/12/2021   Procedure: COLONOSCOPY WITH PROPOFOL;  Surgeon: Annamaria Helling, DO;  Location: [facility] ENDOSCOPY;  Service: Gastroenterology;  Laterality: N/A;   KIDNEY STONE SURGERY     LUMBAR LAMINECTOMY/ DECOMPRESSION WITH MET-RX N/A 06/03/2020   Procedure: L4/5 LAMINECTOMY WITH MET-RX;  Surgeon: Deetta Perla, MD;  Location: [facility] ORS;  Service: Neurosurgery;  Laterality: N/A;   MITRAL VALVE REPAIR      History reviewed. No pertinent family history.  Social History   Socioeconomic History   Marital status: Married    Spouse name: Not on file   Number of children: Not on file   Years of education: Not on file   Highest education level: Not on file  Occupational History   Not on file  Tobacco Use   Smoking status: Never   Smokeless tobacco: Current    Types: Chew  Vaping Use   Vaping Use: Never used  Substance and Sexual Activity   Alcohol use: Yes    Alcohol/week: 8.0 standard drinks of alcohol    Types: 8 Cans of beer per week    Comment: Beer last beer 2 days ago   Drug use: Never   Sexual activity: Yes    Birth control/protection: None  Other Topics Concern   Not on file  Social History Narrative   Not on file   Social Determinants of Health   Financial Resource Strain: Not on file  Food Insecurity: Not on file  Transportation Needs: Not on file   Physical Activity: Not on file  Stress: Not on file  Social Connections: Not on file  Intimate Partner Violence: Not on file     Current Outpatient Medications:    tamsulosin (FLOMAX) 0.4 MG CAPS capsule, Take 1 capsule by mouth once daily, Disp: 90 capsule, Rfl: 0   amiodarone (PACERONE) 200 MG tablet, Take 200 mg by mouth daily. (Patient not taking: Reported on 10/14/2022), Disp: , Rfl:    aspirin 81 MG chewable tablet, Chew by mouth daily. (Patient not taking: Reported on 10/14/2022), Disp: , Rfl:    carvedilol (COREG) 6.25 MG tablet, Take 6.25 mg by mouth at bedtime.  (Patient not taking: Reported on 10/14/2022), Disp: , Rfl:    enalapril (VASOTEC) 10 MG tablet, Take 10 mg by mouth daily. (Patient not taking: Reported on 10/14/2022), Disp: , Rfl:    furosemide (LASIX) 20 MG tablet, Take 20 mg by mouth daily. (Patient not taking: Reported on 10/14/2022), Disp: , Rfl:    LINZESS 145 MCG CAPS capsule, , Disp: , Rfl:    Allergies  Allergen Reactions   Iodinated Contrast Media Rash    ROS Review of Systems  Constitutional: Negative.   HENT: Negative.    Eyes: Negative.   Respiratory: Negative.    Cardiovascular:  Negative.   Gastrointestinal: Negative.   Endocrine: Negative.   Genitourinary: Negative.   Musculoskeletal: Negative.   Skin: Negative.   Allergic/Immunologic: Negative.   Neurological: Negative.   Hematological: Negative.   Psychiatric/Behavioral: Negative.    All other systems reviewed and are negative.     Objective:    Physical Exam Vitals reviewed.  Constitutional:      Appearance: Normal appearance.  HENT:     Mouth/Throat:     Mouth: Mucous membranes are moist.  Eyes:     Pupils: Pupils are equal, round, and reactive to light.  Neck:     Vascular: No carotid bruit.  Cardiovascular:     Rate and Rhythm: Normal rate and regular rhythm.     Pulses: Normal pulses.     Heart sounds: Normal heart sounds.  Pulmonary:     Effort: Pulmonary effort is  normal.     Breath sounds: Normal breath sounds.  Abdominal:     General: Bowel sounds are normal.     Palpations: Abdomen is soft. There is no hepatomegaly, splenomegaly or mass.     Tenderness: There is no abdominal tenderness.     Hernia: No hernia is present.  Musculoskeletal:     Cervical back: Neck supple.     Right lower leg: No edema.     Left lower leg: No edema.  Skin:    Findings: No rash.  Neurological:     Mental Status: He is alert and oriented to person, place, and time.     Motor: No weakness.  Psychiatric:        Mood and Affect: Mood normal.        Behavior: Behavior normal.     BP (!) 158/88   Pulse 75   Ht 6' (1.829 m)   Wt 112 lb 8 oz (51 kg)   SpO2 96%   BMI 15.26 kg/m  Wt Readings from Last 3 Encounters:  10/14/22 112 lb 8 oz (51 kg)  08/06/21 117 lb (53.1 kg)  06/12/21 116 lb (52.6 kg)     Health Maintenance Due  Topic Date Due   Medicare Annual Wellness (AWV)  Never done   Hepatitis C Screening  Never done    There are no preventive care reminders to display for this patient.  Lab Results  Component Value Date   TSH 3.65 05/26/2021   Lab Results  Component Value Date   WBC 5.5 05/26/2021   HGB 11.3 (L) 05/26/2021   HCT 35.1 (L) 05/26/2021   MCV 82.6 05/26/2021   PLT 224 05/26/2021   Lab Results  Component Value Date   NA 133 (L) 05/26/2021   K 4.2 05/26/2021   CO2 20 05/26/2021   GLUCOSE 76 05/26/2021   BUN 8 05/26/2021   CREATININE 0.79 05/26/2021   BILITOT 0.5 05/26/2021   AST 23 05/26/2021   ALT 11 05/26/2021   PROT 6.8 05/26/2021   CALCIUM 8.6 05/26/2021   ANIONGAP 7 05/27/2020   EGFR 91 05/26/2021   No results found for: "CHOL" No results found for: "HDL" No results found for: "LDLCALC" No results found for: "TRIG" No results found for: "CHOLHDL" No results found for: "HGBA1C"    Assessment & Plan:   Problem List Items Addressed This Visit       Digestive   Chronic idiopathic constipation - Primary     Continue with Linzess        Nervous and Auditory   Spinal stenosis of lumbar region  with radiculopathy     Other   Anxiety    - Patient experiencing high levels of anxiety.  - Encouraged patient to engage in relaxing activities like yoga, meditation, journaling, going for a walk, or participating in a hobby.  - Encouraged patient to reach out to trusted friends or family members about recent struggles, Patient was advised to read A book, how to stop worrying and start living, it is good book to read to control  the stress       Tobacco abuse    - I instructed the patient to stop smoking and provided them with smoking cessation materials.  - I informed the patient that smoking puts them at increased risk for cancer, COPD, hypertension, and more.  - Informed the patient to seek help if they begin to have trouble breathing, develop chest pain, start to cough up blood, feel faint, or pass out.      Alcohol abuse    Patient was advised to stop drinking      History of MVR with cardiopulmonary bypass    Heart is regular chest no rhonchi.  Abdomen is soft nontender with no pedal edema.      Patient has a cold and upper respiratory infection.  He was given amoxicillin 500 mg 4 times a day for 7 days.  All his medicines were refilled.  The flu test was done today report is pending  Neg for flu    No orders of the defined types were placed in this encounter.   Follow-up: No follow-ups on file.    Cletis Athens, MD

## 2022-12-29 ENCOUNTER — Other Ambulatory Visit: Payer: Self-pay

## 2022-12-29 ENCOUNTER — Encounter: Payer: Self-pay | Admitting: Intensive Care

## 2022-12-29 ENCOUNTER — Emergency Department
Admission: EM | Admit: 2022-12-29 | Discharge: 2022-12-29 | Disposition: A | Discharge status: Home / Self Care | Payer: Medicare HMO | Attending: Emergency Medicine | Admitting: Emergency Medicine

## 2022-12-29 DIAGNOSIS — Y9301 Activity, walking, marching and hiking: Secondary | ICD-10-CM | POA: Diagnosis not present

## 2022-12-29 DIAGNOSIS — Y92828 Other wilderness area as the place of occurrence of the external cause: Secondary | ICD-10-CM | POA: Insufficient documentation

## 2022-12-29 DIAGNOSIS — X58XXXA Exposure to other specified factors, initial encounter: Secondary | ICD-10-CM | POA: Insufficient documentation

## 2022-12-29 DIAGNOSIS — S83411A Sprain of medial collateral ligament of right knee, initial encounter: Secondary | ICD-10-CM | POA: Insufficient documentation

## 2022-12-29 DIAGNOSIS — M25561 Pain in right knee: Secondary | ICD-10-CM | POA: Diagnosis present

## 2022-12-29 NOTE — ED Triage Notes (Signed)
Patient reports straining right knee. Denies injury. Ambulating with cane

## 2022-12-29 NOTE — ED Provider Notes (Signed)
   [facility] Provider Note    Event Date/Time   First MD Initiated Contact with Patient 12/29/22 [phone]     (approximate)   History   Knee Pain   HPI  Shane Henderson is a 77 y.o. male who presents with complaints of right-sided knee pain.  Patient reports he thinks he sprained it last week when he was walking up a steep hill.  He reports he has been icing it and seems to be getting better.  He is able to ambulate at home but uses a cane when he was walking further distances.     Physical Exam   Triage Vital Signs: ED Triage Vitals  Enc Vitals Group     BP 12/29/22 0909 (!) 168/85     Pulse Rate 12/29/22 0909 64     Resp 12/29/22 0909 16     Temp 12/29/22 0909 97.7 F (36.5 C)     Temp Source 12/29/22 0909 Oral     SpO2 12/29/22 0909 99 %     Weight 12/29/22 0906 52.2 kg (115 lb)     Height 12/29/22 0906 1.676 m ('5\' 6"'$ )     Head Circumference --      Peak Flow --      Pain Score 12/29/22 0905 5     Pain Loc --      Pain Edu? --      Excl. in [place]? --     Most recent vital signs: Vitals:   12/29/22 0909  BP: (!) 168/85  Pulse: 64  Resp: 16  Temp: 97.7 F (36.5 C)  SpO2: 99%     General: Awake, no distress.  CV:  Good peripheral perfusion.  Resp:  Normal effort.  Abd:  No distention.  Other:  Right knee: No bony abnormalities, no swelling, mild tenderness along the medial aspect, no pain with varus or valgus stress, no pain with axial load, ambulates well   ED Results / Procedures / Treatments   Labs (all labs ordered are listed, but only abnormal results are displayed) Labs Reviewed - No data to display   EKG     RADIOLOGY     PROCEDURES:  Critical Care performed:   Procedures   MEDICATIONS ORDERED IN ED: Medications - No data to display   IMPRESSION / MDM / [place] / ED COURSE  I reviewed the triage vital signs and the nursing notes. Patient's presentation is most consistent with acute,  uncomplicated illness.  Exam and HPI most consistent with knee sprain, likely MCL.  Recommend continued supportive care, ice, elevation, Tylenol as needed        FINAL CLINICAL IMPRESSION(S) / ED DIAGNOSES   Final diagnoses:  Sprain of medial collateral ligament of right knee, initial encounter     Rx / DC Orders   ED Discharge Orders     None        Note:  This document was prepared using Dragon voice recognition software and may include unintentional dictation errors.   Lavonia Drafts, MD 12/29/22 0930

## 2023-01-05 ENCOUNTER — Other Ambulatory Visit: Payer: Self-pay | Admitting: Cardiovascular Disease

## 2023-04-01 ENCOUNTER — Other Ambulatory Visit: Payer: Self-pay | Admitting: Cardiovascular Disease

## 2023-05-31 ENCOUNTER — Other Ambulatory Visit: Payer: Self-pay | Admitting: Cardiovascular Disease

## 2023-06-25 ENCOUNTER — Other Ambulatory Visit: Payer: Self-pay | Admitting: Cardiovascular Disease

## 2023-07-12 ENCOUNTER — Other Ambulatory Visit: Payer: Self-pay | Admitting: Cardiovascular Disease

## 2023-08-05 ENCOUNTER — Ambulatory Visit: Payer: Medicare HMO | Admitting: Cardiovascular Disease

## 2023-08-05 ENCOUNTER — Encounter: Payer: Self-pay | Admitting: Cardiovascular Disease

## 2023-08-05 VITALS — BP 150/80 | HR 55 | Ht 72.0 in | Wt 109.8 lb

## 2023-08-05 DIAGNOSIS — Z952 Presence of prosthetic heart valve: Secondary | ICD-10-CM | POA: Diagnosis not present

## 2023-08-05 DIAGNOSIS — F1010 Alcohol abuse, uncomplicated: Secondary | ICD-10-CM

## 2023-08-05 DIAGNOSIS — E782 Mixed hyperlipidemia: Secondary | ICD-10-CM | POA: Diagnosis not present

## 2023-08-05 DIAGNOSIS — I10 Essential (primary) hypertension: Secondary | ICD-10-CM

## 2023-08-05 MED ORDER — ENALAPRIL MALEATE 20 MG PO TABS
20.0000 mg | ORAL_TABLET | Freq: Every day | ORAL | 11 refills | Status: AC
Start: 2023-08-05 — End: 2024-08-22

## 2023-08-05 NOTE — Progress Notes (Signed)
Cardiology Office Note   Date:  08/05/2023   ID:  Shane Henderson, Shane Henderson 05-06-1946, MRN [id number]  PCP:  Corky Downs, MD  Cardiologist:  Adrian Blackwater, MD      History of Present Illness: Shane Henderson is a 77 y.o. male who presents for  Chief Complaint  Patient presents with   Follow-up    Annual visit    Feeling fine      Past Medical History:  Diagnosis Date   Anxiety    Constipation    Dysrhythmia    History of kidney stones    Lumbar radiculopathy    Lumbar stenosis    Neuromuscular disorder [facility])      Past Surgical History:  Procedure Laterality Date   COLONOSCOPY WITH PROPOFOL N/A 06/12/2021   Procedure: COLONOSCOPY WITH PROPOFOL;  Surgeon: Jaynie Collins, DO;  Location: [facility] ENDOSCOPY;  Service: Gastroenterology;  Laterality: N/A;   KIDNEY STONE SURGERY     LUMBAR LAMINECTOMY/ DECOMPRESSION WITH MET-RX N/A 06/03/2020   Procedure: L4/5 LAMINECTOMY WITH MET-RX;  Surgeon: Lucy Chris, MD;  Location: [facility] ORS;  Service: Neurosurgery;  Laterality: N/A;   MITRAL VALVE REPAIR       Current Outpatient Medications  Medication Sig Dispense Refill   enalapril (VASOTEC) 20 MG tablet Take 1 tablet (20 mg total) by mouth daily. 30 tablet 11   amiodarone (PACERONE) 200 MG tablet Take 1 tablet by mouth once daily 90 tablet 0   amiodarone (PACERONE) 200 MG tablet Take 1 tablet by mouth once daily 90 tablet 0   aspirin 81 MG chewable tablet Chew by mouth daily. (Patient not taking: Reported on 10/14/2022)     carvedilol (COREG) 6.25 MG tablet Take 1 tablet by mouth twice daily 180 tablet 0   furosemide (LASIX) 20 MG tablet Take 20 mg by mouth daily. (Patient not taking: Reported on 10/14/2022)     LINZESS 145 MCG CAPS capsule  (Patient not taking: Reported on 10/14/2022)     tamsulosin (FLOMAX) 0.4 MG CAPS capsule Take 1 capsule (0.4 mg total) by mouth daily. 90 capsule 0   No current facility-administered medications for this visit.    Allergies:    Iodinated contrast media    Social History:   reports that he has never smoked. His smokeless tobacco use includes chew. He reports current alcohol use of about 35.0 standard drinks of alcohol per week. He reports that he does not use drugs.   Family History:  family history is not on file.    ROS:     Review of Systems  Constitutional: Negative.   HENT: Negative.    Eyes: Negative.   Respiratory: Negative.    Gastrointestinal: Negative.   Genitourinary: Negative.   Musculoskeletal: Negative.   Skin: Negative.   Neurological: Negative.   Endo/Heme/Allergies: Negative.   Psychiatric/Behavioral: Negative.    All other systems reviewed and are negative.     All other systems are reviewed and negative.    PHYSICAL EXAM: VS:  BP (!) 150/80   Pulse (!) 55   Ht 6' (1.829 m)   Wt 109 lb 12.8 oz (49.8 kg)   SpO2 96%   BMI 14.89 kg/m  , BMI Body mass index is 14.89 kg/m. Last weight:  Wt Readings from Last 3 Encounters:  08/05/23 109 lb 12.8 oz (49.8 kg)  12/29/22 115 lb (52.2 kg)  10/14/22 112 lb 8 oz (51 kg)     Physical Exam Vitals reviewed.  Constitutional:  Appearance: Normal appearance. He is normal weight.  HENT:     Head: Normocephalic.     Nose: Nose normal.     Mouth/Throat:     Mouth: Mucous membranes are moist.  Eyes:     Pupils: Pupils are equal, round, and reactive to light.  Cardiovascular:     Rate and Rhythm: Normal rate and regular rhythm.     Pulses: Normal pulses.     Heart sounds: Normal heart sounds.  Pulmonary:     Effort: Pulmonary effort is normal.  Abdominal:     General: Abdomen is flat. Bowel sounds are normal.  Musculoskeletal:        General: Normal range of motion.     Cervical back: Normal range of motion.  Skin:    General: Skin is warm.  Neurological:     General: No focal deficit present.     Mental Status: He is alert.  Psychiatric:        Mood and Affect: Mood normal.       EKG:   Recent Labs: No results  found for requested labs within last 365 days.    Lipid Panel No results found for: "CHOL", "TRIG", "HDL", "CHOLHDL", "VLDL", "LDLCALC", "LDLDIRECT"    Other studies Reviewed: Additional studies/ records that were reviewed today include:  Review of the above records demonstrates:       No data to display            ASSESSMENT AND PLAN:    ICD-10-CM   1. History of MVR with cardiopulmonary bypass  Z95.2 enalapril (VASOTEC) 20 MG tablet    PCV ECHOCARDIOGRAM COMPLETE    2. Mixed hyperlipidemia  E78.2 enalapril (VASOTEC) 20 MG tablet    PCV ECHOCARDIOGRAM COMPLETE    3. Primary hypertension  I10 enalapril (VASOTEC) 20 MG tablet    PCV ECHOCARDIOGRAM COMPLETE   elevated go to 20 enalpril    4. Alcohol abuse  F10.10 enalapril (VASOTEC) 20 MG tablet    PCV ECHOCARDIOGRAM COMPLETE       Problem List Items Addressed This Visit       Other   Alcohol abuse   Relevant Medications   enalapril (VASOTEC) 20 MG tablet   Other Relevant Orders   PCV ECHOCARDIOGRAM COMPLETE   History of MVR with cardiopulmonary bypass - Primary   Relevant Medications   enalapril (VASOTEC) 20 MG tablet   Other Relevant Orders   PCV ECHOCARDIOGRAM COMPLETE   Other Visit Diagnoses     Mixed hyperlipidemia       Relevant Medications   enalapril (VASOTEC) 20 MG tablet   Other Relevant Orders   PCV ECHOCARDIOGRAM COMPLETE   Primary hypertension       elevated go to 20 enalpril   Relevant Medications   enalapril (VASOTEC) 20 MG tablet   Other Relevant Orders   PCV ECHOCARDIOGRAM COMPLETE          Disposition:   Return in about 4 weeks (around 09/02/2023) for echo and f/u.    Total time spent: 30 minutes  Signed,  Adrian Blackwater, MD  08/05/2023 10:12 AM    [facility]

## 2023-09-06 ENCOUNTER — Ambulatory Visit (INDEPENDENT_AMBULATORY_CARE_PROVIDER_SITE_OTHER): Payer: Medicare HMO

## 2023-09-06 DIAGNOSIS — F1010 Alcohol abuse, uncomplicated: Secondary | ICD-10-CM

## 2023-09-06 DIAGNOSIS — I361 Nonrheumatic tricuspid (valve) insufficiency: Secondary | ICD-10-CM

## 2023-09-06 DIAGNOSIS — I10 Essential (primary) hypertension: Secondary | ICD-10-CM

## 2023-09-06 DIAGNOSIS — I351 Nonrheumatic aortic (valve) insufficiency: Secondary | ICD-10-CM

## 2023-09-06 DIAGNOSIS — I371 Nonrheumatic pulmonary valve insufficiency: Secondary | ICD-10-CM

## 2023-09-06 DIAGNOSIS — I340 Nonrheumatic mitral (valve) insufficiency: Secondary | ICD-10-CM | POA: Diagnosis not present

## 2023-09-06 DIAGNOSIS — E782 Mixed hyperlipidemia: Secondary | ICD-10-CM

## 2023-09-06 DIAGNOSIS — Z952 Presence of prosthetic heart valve: Secondary | ICD-10-CM

## 2023-09-09 ENCOUNTER — Ambulatory Visit: Payer: Medicare HMO | Admitting: Cardiovascular Disease

## 2023-09-09 ENCOUNTER — Encounter: Payer: Self-pay | Admitting: Cardiovascular Disease

## 2023-09-09 VITALS — BP 102/82 | HR 64 | Ht 72.0 in | Wt 110.4 lb

## 2023-09-09 DIAGNOSIS — I351 Nonrheumatic aortic (valve) insufficiency: Secondary | ICD-10-CM

## 2023-09-09 DIAGNOSIS — I340 Nonrheumatic mitral (valve) insufficiency: Secondary | ICD-10-CM | POA: Diagnosis not present

## 2023-09-09 DIAGNOSIS — E782 Mixed hyperlipidemia: Secondary | ICD-10-CM

## 2023-09-09 DIAGNOSIS — Z952 Presence of prosthetic heart valve: Secondary | ICD-10-CM | POA: Diagnosis not present

## 2023-09-09 DIAGNOSIS — I10 Essential (primary) hypertension: Secondary | ICD-10-CM | POA: Diagnosis not present

## 2023-09-09 NOTE — Progress Notes (Signed)
Cardiology Office Note   Date:  09/09/2023   ID:  Oshea, Lamore October 08, 1946, MRN [id number]  PCP:  Corky Downs, MD  Cardiologist:  Adrian Blackwater, MD      History of Present Illness: Shane Henderson is a 77 y.o. male who presents for  Chief Complaint  Patient presents with   Follow-up    Echo results    HPI    Past Medical History:  Diagnosis Date   Anxiety    Constipation    Dysrhythmia    History of kidney stones    Lumbar radiculopathy    Lumbar stenosis    Neuromuscular disorder [facility])      Past Surgical History:  Procedure Laterality Date   COLONOSCOPY WITH PROPOFOL N/A 06/12/2021   Procedure: COLONOSCOPY WITH PROPOFOL;  Surgeon: Jaynie Collins, DO;  Location: [facility] ENDOSCOPY;  Service: Gastroenterology;  Laterality: N/A;   KIDNEY STONE SURGERY     LUMBAR LAMINECTOMY/ DECOMPRESSION WITH MET-RX N/A 06/03/2020   Procedure: L4/5 LAMINECTOMY WITH MET-RX;  Surgeon: Lucy Chris, MD;  Location: [facility] ORS;  Service: Neurosurgery;  Laterality: N/A;   MITRAL VALVE REPAIR       Current Outpatient Medications  Medication Sig Dispense Refill   amiodarone (PACERONE) 200 MG tablet Take 1 tablet by mouth once daily 90 tablet 0   amiodarone (PACERONE) 200 MG tablet Take 1 tablet by mouth once daily 90 tablet 0   carvedilol (COREG) 6.25 MG tablet Take 1 tablet by mouth twice daily 180 tablet 0   enalapril (VASOTEC) 20 MG tablet Take 1 tablet (20 mg total) by mouth daily. 30 tablet 11   tamsulosin (FLOMAX) 0.4 MG CAPS capsule Take 1 capsule (0.4 mg total) by mouth daily. 90 capsule 0   aspirin 81 MG chewable tablet Chew by mouth daily. (Patient not taking: Reported on 10/14/2022)     furosemide (LASIX) 20 MG tablet Take 20 mg by mouth daily. (Patient not taking: Reported on 10/14/2022)     No current facility-administered medications for this visit.    Allergies:   Iodinated contrast media    Social History:   reports that he has never smoked. His smokeless  tobacco use includes chew. He reports current alcohol use of about 35.0 standard drinks of alcohol per week. He reports that he does not use drugs.   Family History:  family history is not on file.    ROS:     Review of Systems  Constitutional: Negative.   HENT: Negative.    Eyes: Negative.   Respiratory: Negative.    Gastrointestinal: Negative.   Genitourinary: Negative.   Musculoskeletal: Negative.   Skin: Negative.   Neurological: Negative.   Endo/Heme/Allergies: Negative.   Psychiatric/Behavioral: Negative.    All other systems reviewed and are negative.     All other systems are reviewed and negative.    PHYSICAL EXAM: VS:  BP 102/82   Pulse 64   Ht 6' (1.829 m)   Wt 110 lb 6.4 oz (50.1 kg)   SpO2 96%   BMI 14.97 kg/m  , BMI Body mass index is 14.97 kg/m. Last weight:  Wt Readings from Last 3 Encounters:  09/09/23 110 lb 6.4 oz (50.1 kg)  08/05/23 109 lb 12.8 oz (49.8 kg)  12/29/22 115 lb (52.2 kg)     Physical Exam Vitals reviewed.  Constitutional:      Appearance: Normal appearance. He is normal weight.  HENT:     Head: Normocephalic.  Nose: Nose normal.     Mouth/Throat:     Mouth: Mucous membranes are moist.  Eyes:     Pupils: Pupils are equal, round, and reactive to light.  Cardiovascular:     Rate and Rhythm: Normal rate and regular rhythm.     Pulses: Normal pulses.     Heart sounds: Normal heart sounds.  Pulmonary:     Effort: Pulmonary effort is normal.  Abdominal:     General: Abdomen is flat. Bowel sounds are normal.  Musculoskeletal:        General: Normal range of motion.     Cervical back: Normal range of motion.  Skin:    General: Skin is warm.  Neurological:     General: No focal deficit present.     Mental Status: He is alert.  Psychiatric:        Mood and Affect: Mood normal.       EKG:   Recent Labs: No results found for requested labs within last 365 days.    Lipid Panel No results found for: "CHOL", "TRIG",  "HDL", "CHOLHDL", "VLDL", "LDLCALC", "LDLDIRECT"    Other studies Reviewed: Additional studies/ records that were reviewed today include:  Review of the above records demonstrates:       No data to display            ASSESSMENT AND PLAN:    ICD-10-CM   1. History of MVR with cardiopulmonary bypass  Z95.2    MV thickened redundant, moderate MR, not SOB    2. Primary hypertension  I10     3. Mixed hyperlipidemia  E78.2     4. Nonrheumatic mitral valve regurgitation  I34.0    moderate    5. Nonrheumatic aortic valve insufficiency  I35.1    moderate       Problem List Items Addressed This Visit       Other   History of MVR with cardiopulmonary bypass - Primary   Other Visit Diagnoses     Primary hypertension       Mixed hyperlipidemia       Nonrheumatic mitral valve regurgitation       moderate   Nonrheumatic aortic valve insufficiency       moderate          Disposition:   Return in about 4 months (around 01/07/2024).    Total time spent: 30 minutes  Signed,  Adrian Blackwater, MD  09/09/2023 9:49 AM    [facility]

## 2023-10-22 ENCOUNTER — Other Ambulatory Visit: Payer: Self-pay | Admitting: Cardiovascular Disease

## 2023-10-25 MED ORDER — TAMSULOSIN HCL 0.4 MG PO CAPS: 0.4000 mg | ORAL_CAPSULE | Freq: Every day | ORAL | 0 refills | Status: DC

## 2023-11-11 DIAGNOSIS — K581 Irritable bowel syndrome with constipation: Secondary | ICD-10-CM | POA: Diagnosis not present

## 2023-11-11 DIAGNOSIS — N401 Enlarged prostate with lower urinary tract symptoms: Secondary | ICD-10-CM | POA: Diagnosis not present

## 2023-11-11 DIAGNOSIS — Z1389 Encounter for screening for other disorder: Secondary | ICD-10-CM | POA: Diagnosis not present

## 2023-11-11 DIAGNOSIS — Z0130 Encounter for examination of blood pressure without abnormal findings: Secondary | ICD-10-CM | POA: Diagnosis not present

## 2023-11-11 DIAGNOSIS — Z0131 Encounter for examination of blood pressure with abnormal findings: Secondary | ICD-10-CM | POA: Diagnosis not present

## 2024-03-09 ENCOUNTER — Ambulatory Visit: Payer: Medicare HMO | Admitting: Cardiovascular Disease

## 2024-04-07 ENCOUNTER — Other Ambulatory Visit: Payer: Self-pay | Admitting: Cardiovascular Disease

## 2024-04-23 ENCOUNTER — Other Ambulatory Visit: Payer: Self-pay | Admitting: Cardiovascular Disease

## 2024-06-25 ENCOUNTER — Other Ambulatory Visit: Payer: Self-pay | Admitting: Cardiovascular Disease

## 2024-07-11 ENCOUNTER — Other Ambulatory Visit: Payer: Self-pay | Admitting: Cardiovascular Disease

## 2024-07-13 ENCOUNTER — Telehealth: Payer: Self-pay

## 2024-07-13 ENCOUNTER — Other Ambulatory Visit: Payer: Self-pay

## 2024-07-13 NOTE — Telephone Encounter (Signed)
 Rx was sent today.

## 2024-07-13 NOTE — Telephone Encounter (Signed)
 Pt is requesting refills be sent in. It shows they are pending as of 9/16 but the pharmacy says they do not have it yet.

## 2024-07-24 ENCOUNTER — Other Ambulatory Visit: Payer: Self-pay | Admitting: Cardiovascular Disease

## 2024-07-25 ENCOUNTER — Other Ambulatory Visit: Payer: Self-pay | Admitting: Cardiovascular Disease

## 2024-08-22 ENCOUNTER — Encounter: Payer: Self-pay | Admitting: Cardiovascular Disease

## 2024-08-22 ENCOUNTER — Ambulatory Visit: Admitting: Cardiovascular Disease

## 2024-08-22 VITALS — BP 122/71 | HR 58 | Ht 72.0 in | Wt 106.6 lb

## 2024-08-22 DIAGNOSIS — E782 Mixed hyperlipidemia: Secondary | ICD-10-CM

## 2024-08-22 DIAGNOSIS — I340 Nonrheumatic mitral (valve) insufficiency: Secondary | ICD-10-CM

## 2024-08-22 DIAGNOSIS — Z952 Presence of prosthetic heart valve: Secondary | ICD-10-CM

## 2024-08-22 DIAGNOSIS — I10 Essential (primary) hypertension: Secondary | ICD-10-CM | POA: Diagnosis not present

## 2024-08-22 DIAGNOSIS — I351 Nonrheumatic aortic (valve) insufficiency: Secondary | ICD-10-CM | POA: Diagnosis not present

## 2024-08-22 DIAGNOSIS — F1010 Alcohol abuse, uncomplicated: Secondary | ICD-10-CM

## 2024-08-22 MED ORDER — FUROSEMIDE 20 MG PO TABS: 20.0000 mg | ORAL_TABLET | Freq: Every day | ORAL | 2 refills | Status: AC

## 2024-08-22 MED ORDER — ASPIRIN 81 MG PO CHEW: 81.0000 mg | CHEWABLE_TABLET | Freq: Every day | ORAL | 2 refills | Status: AC

## 2024-08-22 NOTE — Progress Notes (Signed)
 Cardiology Office Note   Date:  08/22/2024   ID:  Shaarav, Ripple 04-29-1946, MRN [id number]  PCP:  Britta King, MD  Cardiologist:  Denyse Bathe, MD      History of Present Illness: Shane Henderson is a 78 y.o. male who presents for  Chief Complaint  Patient presents with   Follow-up    Follow up    Has IBS, no chest pain or SOB.      Past Medical History:  Diagnosis Date   Anxiety    Constipation    Dysrhythmia    History of kidney stones    Lumbar radiculopathy    Lumbar stenosis    Neuromuscular disorder [facility])      Past Surgical History:  Procedure Laterality Date   COLONOSCOPY WITH PROPOFOL  N/A 06/12/2021   Procedure: COLONOSCOPY WITH PROPOFOL ;  Surgeon: Onita Elspeth Sharper, DO;  Location: [facility] ENDOSCOPY;  Service: Gastroenterology;  Laterality: N/A;   KIDNEY STONE SURGERY     LUMBAR LAMINECTOMY/ DECOMPRESSION WITH MET-RX N/A 06/03/2020   Procedure: L4/5 LAMINECTOMY WITH MET-RX;  Surgeon: Bluford Elspeth, MD;  Location: [facility] ORS;  Service: Neurosurgery;  Laterality: N/A;   MITRAL VALVE REPAIR       Current Outpatient Medications  Medication Sig Dispense Refill   amiodarone  (PACERONE ) 200 MG tablet Take 1 tablet by mouth once daily 90 tablet 0   carvedilol  (COREG ) 6.25 MG tablet Take 1 tablet by mouth twice daily 180 tablet 0   enalapril  (VASOTEC ) 20 MG tablet Take 1 tablet (20 mg total) by mouth daily. 30 tablet 11   tamsulosin  (FLOMAX ) 0.4 MG CAPS capsule Take 1 capsule by mouth once daily 90 capsule 0   aspirin 81 MG chewable tablet Chew 1 tablet (81 mg total) by mouth daily. 30 tablet 2   furosemide (LASIX) 20 MG tablet Take 1 tablet (20 mg total) by mouth daily. 30 tablet 2   No current facility-administered medications for this visit.    Allergies:   Iodinated contrast media    Social History:   reports that he has never smoked. His smokeless tobacco use includes chew. He reports current alcohol use of about 35.0 standard drinks of alcohol  per week. He reports that he does not use drugs.   Family History:  family history is not on file.    ROS:     Review of Systems  Constitutional: Negative.   HENT: Negative.    Eyes: Negative.   Respiratory: Negative.    Gastrointestinal: Negative.   Genitourinary: Negative.   Musculoskeletal: Negative.   Skin: Negative.   Neurological: Negative.   Endo/Heme/Allergies: Negative.   Psychiatric/Behavioral: Negative.    All other systems reviewed and are negative.     All other systems are reviewed and negative.    PHYSICAL EXAM: VS:  BP 122/71   Pulse (!) 58   Ht 6' (1.829 m)   Wt 106 lb 9.6 oz (48.4 kg)   SpO2 99%   BMI 14.46 kg/m  , BMI Body mass index is 14.46 kg/m. Last weight:  Wt Readings from Last 3 Encounters:  08/22/24 106 lb 9.6 oz (48.4 kg)  09/09/23 110 lb 6.4 oz (50.1 kg)  08/05/23 109 lb 12.8 oz (49.8 kg)     Physical Exam Vitals reviewed.  Constitutional:      Appearance: Normal appearance. He is normal weight.  HENT:     Head: Normocephalic.     Nose: Nose normal.     Mouth/Throat:  Mouth: Mucous membranes are moist.  Eyes:     Pupils: Pupils are equal, round, and reactive to light.  Cardiovascular:     Rate and Rhythm: Normal rate and regular rhythm.     Pulses: Normal pulses.     Heart sounds: Normal heart sounds.  Pulmonary:     Effort: Pulmonary effort is normal.  Abdominal:     General: Abdomen is flat. Bowel sounds are normal.  Musculoskeletal:        General: Normal range of motion.     Cervical back: Normal range of motion.  Skin:    General: Skin is warm.  Neurological:     General: No focal deficit present.     Mental Status: He is alert.  Psychiatric:        Mood and Affect: Mood normal.       EKG:   Recent Labs: No results found for requested labs within last 365 days.    Lipid Panel No results found for: CHOL, TRIG, HDL, CHOLHDL, VLDL, LDLCALC, LDLDIRECT    Other studies  Reviewed: Additional studies/ records that were reviewed today include:  Review of the above records demonstrates:       No data to display            ASSESSMENT AND PLAN:    ICD-10-CM   1. History of MVR with cardiopulmonary bypass  Z95.2 furosemide (LASIX) 20 MG tablet    aspirin 81 MG chewable tablet    PCV ECHOCARDIOGRAM COMPLETE   Stable, repeat ECHO.    2. Alcohol abuse  F10.10 furosemide (LASIX) 20 MG tablet    aspirin 81 MG chewable tablet    PCV ECHOCARDIOGRAM COMPLETE    3. Nonrheumatic mitral valve regurgitation  I34.0 PCV ECHOCARDIOGRAM COMPLETE    4. Nonrheumatic aortic valve insufficiency  I35.1 PCV ECHOCARDIOGRAM COMPLETE    5. Mixed hyperlipidemia  E78.2 PCV ECHOCARDIOGRAM COMPLETE    6. Primary hypertension  I10 PCV ECHOCARDIOGRAM COMPLETE       Problem List Items Addressed This Visit       Other   Alcohol abuse   Relevant Medications   furosemide (LASIX) 20 MG tablet   aspirin 81 MG chewable tablet   Other Relevant Orders   PCV ECHOCARDIOGRAM COMPLETE   History of MVR with cardiopulmonary bypass - Primary   Relevant Medications   furosemide (LASIX) 20 MG tablet   aspirin 81 MG chewable tablet   Other Relevant Orders   PCV ECHOCARDIOGRAM COMPLETE   Other Visit Diagnoses       Nonrheumatic mitral valve regurgitation       Relevant Medications   furosemide (LASIX) 20 MG tablet   aspirin 81 MG chewable tablet   Other Relevant Orders   PCV ECHOCARDIOGRAM COMPLETE     Nonrheumatic aortic valve insufficiency       Relevant Medications   furosemide (LASIX) 20 MG tablet   aspirin 81 MG chewable tablet   Other Relevant Orders   PCV ECHOCARDIOGRAM COMPLETE     Mixed hyperlipidemia       Relevant Medications   furosemide (LASIX) 20 MG tablet   aspirin 81 MG chewable tablet   Other Relevant Orders   PCV ECHOCARDIOGRAM COMPLETE     Primary hypertension       Relevant Medications   furosemide (LASIX) 20 MG tablet   aspirin 81 MG chewable  tablet   Other Relevant Orders   PCV ECHOCARDIOGRAM COMPLETE  Disposition:   Return in about 3 months (around 11/22/2024) for echo prior to visit.    Total time spent: 30 minutes  Signed,  Denyse Bathe, MD  08/22/2024 9:56 AM    [facility]

## 2024-10-10 ENCOUNTER — Other Ambulatory Visit: Payer: Self-pay | Admitting: Cardiovascular Disease

## 2024-10-12 ENCOUNTER — Other Ambulatory Visit: Payer: Self-pay

## 2024-12-05 ENCOUNTER — Other Ambulatory Visit

## 2024-12-19 ENCOUNTER — Ambulatory Visit: Admitting: Cardiovascular Disease
